# Patient Record
Sex: Female | Born: 1995 | Hispanic: No | Marital: Married | Smoking: Never smoker
Health system: Southern US, Community
[De-identification: ages and names within clinical notes are randomized; demographics above are authoritative.]

## PROBLEM LIST (undated history)

## (undated) DIAGNOSIS — Z789 Other specified health status: Secondary | ICD-10-CM

## (undated) HISTORY — PX: NO PAST SURGERIES: SHX2092

---
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
## 2014-10-11 ENCOUNTER — Ambulatory Visit
Admission: RE | Admit: 2014-10-11 | Discharge: 2014-10-11 | Disposition: A | Discharge status: Home / Self Care | Payer: No Typology Code available for payment source | Source: Ambulatory Visit | Attending: Infectious Disease | Admitting: Infectious Disease

## 2014-10-11 ENCOUNTER — Other Ambulatory Visit: Payer: Self-pay | Admitting: Infectious Disease

## 2014-10-11 DIAGNOSIS — R7611 Nonspecific reaction to tuberculin skin test without active tuberculosis: Secondary | ICD-10-CM

## 2015-08-12 LAB — OB RESULTS CONSOLE ABO/RH: RH Type: POSITIVE

## 2015-08-12 LAB — OB RESULTS CONSOLE GC/CHLAMYDIA
CHLAMYDIA, DNA PROBE: NEGATIVE
Gonorrhea: NEGATIVE

## 2015-08-12 LAB — OB RESULTS CONSOLE RUBELLA ANTIBODY, IGM: RUBELLA: IMMUNE

## 2015-08-12 LAB — OB RESULTS CONSOLE ANTIBODY SCREEN: Antibody Screen: NEGATIVE

## 2015-08-12 LAB — OB RESULTS CONSOLE RPR: RPR: NONREACTIVE

## 2015-08-12 LAB — OB RESULTS CONSOLE HEPATITIS B SURFACE ANTIGEN: HEP B S AG: NEGATIVE

## 2015-08-12 LAB — OB RESULTS CONSOLE HIV ANTIBODY (ROUTINE TESTING): HIV: NONREACTIVE

## 2015-09-24 ENCOUNTER — Other Ambulatory Visit: Payer: Self-pay | Admitting: Obstetrics & Gynecology

## 2015-09-24 DIAGNOSIS — Z3689 Encounter for other specified antenatal screening: Secondary | ICD-10-CM

## 2015-09-24 DIAGNOSIS — O280 Abnormal hematological finding on antenatal screening of mother: Secondary | ICD-10-CM

## 2015-09-24 DIAGNOSIS — IMO0002 Reserved for concepts with insufficient information to code with codable children: Secondary | ICD-10-CM

## 2015-09-29 NOTE — L&D Delivery Note (Signed)
Delivery Note At 02:11, a viable female was delivered via SVD.  APGAR: 7, 8; weight 2565g.   Placenta status: intact, spontaneous.  Cord: 3-vessel with the following complications: none.  Cord pH: not obtained  Anesthesia: epidural  Episiotomy: none Lacerations: 2nd degree perineal, bilateral labial Suture Repair: 3-0 vicryl for 2nd degree, 2-0 vicryl for labial tears Est. Blood Loss (mL): 150cc  Mom to postpartum.  Baby to Couplet care / Skin to Skin.  Amber ChestnutKelly E Moran 01/30/2016, 2:56 AM  Patient is a G1 at 8061w2d who was admitted for IOL due to nonreactive tracing, uncomplicated prenatal course.  She progressed with augmentation via foley/Pit/AROM.  I was gloved and present for delivery in its entirety.  Second stage of labor progressed to SVD.    Complications: none  Lacerations: see above  EBL: 150  Amber Moran, CNM 11:19 AM

## 2015-10-02 ENCOUNTER — Ambulatory Visit (HOSPITAL_COMMUNITY)
Admission: RE | Admit: 2015-10-02 | Discharge: 2015-10-02 | Disposition: A | Discharge status: Home / Self Care | Payer: Medicaid Other | Source: Ambulatory Visit | Attending: Obstetrics & Gynecology | Admitting: Obstetrics & Gynecology

## 2015-10-02 ENCOUNTER — Encounter (HOSPITAL_COMMUNITY): Payer: Self-pay

## 2015-10-02 ENCOUNTER — Other Ambulatory Visit: Payer: Self-pay | Admitting: Obstetrics & Gynecology

## 2015-10-02 VITALS — BP 103/62 | HR 93 | Wt 125.4 lb

## 2015-10-02 DIAGNOSIS — Z3689 Encounter for other specified antenatal screening: Secondary | ICD-10-CM

## 2015-10-02 DIAGNOSIS — O35EXX0 Maternal care for other (suspected) fetal abnormality and damage, fetal genitourinary anomalies, not applicable or unspecified: Secondary | ICD-10-CM

## 2015-10-02 DIAGNOSIS — O283 Abnormal ultrasonic finding on antenatal screening of mother: Secondary | ICD-10-CM | POA: Insufficient documentation

## 2015-10-02 DIAGNOSIS — O358XX0 Maternal care for other (suspected) fetal abnormality and damage, not applicable or unspecified: Secondary | ICD-10-CM

## 2015-10-02 DIAGNOSIS — Z3A23 23 weeks gestation of pregnancy: Secondary | ICD-10-CM

## 2015-10-02 DIAGNOSIS — O359XX0 Maternal care for (suspected) fetal abnormality and damage, unspecified, not applicable or unspecified: Secondary | ICD-10-CM

## 2015-10-02 DIAGNOSIS — IMO0002 Reserved for concepts with insufficient information to code with codable children: Secondary | ICD-10-CM

## 2015-10-02 DIAGNOSIS — O280 Abnormal hematological finding on antenatal screening of mother: Secondary | ICD-10-CM

## 2015-10-07 DIAGNOSIS — O359XX0 Maternal care for (suspected) fetal abnormality and damage, unspecified, not applicable or unspecified: Secondary | ICD-10-CM | POA: Insufficient documentation

## 2015-10-09 ENCOUNTER — Other Ambulatory Visit (HOSPITAL_COMMUNITY): Payer: Self-pay

## 2015-10-09 ENCOUNTER — Encounter (HOSPITAL_COMMUNITY): Payer: Self-pay

## 2015-11-13 ENCOUNTER — Ambulatory Visit (HOSPITAL_COMMUNITY)
Admission: RE | Admit: 2015-11-13 | Discharge: 2015-11-13 | Disposition: A | Discharge status: Home / Self Care | Payer: Medicaid Other | Source: Ambulatory Visit | Attending: Obstetrics & Gynecology | Admitting: Obstetrics & Gynecology

## 2015-11-13 DIAGNOSIS — IMO0002 Reserved for concepts with insufficient information to code with codable children: Secondary | ICD-10-CM

## 2015-11-13 DIAGNOSIS — O358XX0 Maternal care for other (suspected) fetal abnormality and damage, not applicable or unspecified: Secondary | ICD-10-CM | POA: Diagnosis present

## 2015-11-13 DIAGNOSIS — Z3A29 29 weeks gestation of pregnancy: Secondary | ICD-10-CM | POA: Diagnosis not present

## 2015-11-13 DIAGNOSIS — O283 Abnormal ultrasonic finding on antenatal screening of mother: Secondary | ICD-10-CM | POA: Insufficient documentation

## 2016-01-01 LAB — OB RESULTS CONSOLE GBS: GBS: NEGATIVE

## 2016-01-28 ENCOUNTER — Other Ambulatory Visit (HOSPITAL_COMMUNITY): Payer: Self-pay | Admitting: Nurse Practitioner

## 2016-01-28 ENCOUNTER — Encounter (HOSPITAL_COMMUNITY): Payer: Self-pay | Admitting: *Deleted

## 2016-01-28 ENCOUNTER — Inpatient Hospital Stay (HOSPITAL_COMMUNITY)
Admission: AD | Admit: 2016-01-28 | Discharge: 2016-01-31 | DRG: 775 | Disposition: A | Discharge status: Home / Self Care | Payer: Medicaid Other | Source: Ambulatory Visit | Attending: Family Medicine | Admitting: Family Medicine

## 2016-01-28 ENCOUNTER — Ambulatory Visit (HOSPITAL_COMMUNITY)
Admission: RE | Admit: 2016-01-28 | Discharge: 2016-01-28 | Disposition: A | Discharge status: Home / Self Care | Payer: Medicaid Other | Source: Ambulatory Visit | Attending: Nurse Practitioner | Admitting: Nurse Practitioner

## 2016-01-28 DIAGNOSIS — O480 Post-term pregnancy: Secondary | ICD-10-CM

## 2016-01-28 DIAGNOSIS — O9962 Diseases of the digestive system complicating childbirth: Secondary | ICD-10-CM | POA: Diagnosis present

## 2016-01-28 DIAGNOSIS — O289 Unspecified abnormal findings on antenatal screening of mother: Secondary | ICD-10-CM

## 2016-01-28 DIAGNOSIS — K219 Gastro-esophageal reflux disease without esophagitis: Secondary | ICD-10-CM | POA: Diagnosis present

## 2016-01-28 DIAGNOSIS — Z3A40 40 weeks gestation of pregnancy: Secondary | ICD-10-CM

## 2016-01-28 DIAGNOSIS — IMO0002 Reserved for concepts with insufficient information to code with codable children: Secondary | ICD-10-CM | POA: Diagnosis present

## 2016-01-28 HISTORY — DX: Other specified health status: Z78.9

## 2016-01-28 LAB — CBC
HEMATOCRIT: 39.4 % (ref 36.0–46.0)
HEMOGLOBIN: 13.7 g/dL (ref 12.0–15.0)
MCH: 30.2 pg (ref 26.0–34.0)
MCHC: 34.8 g/dL (ref 30.0–36.0)
MCV: 87 fL (ref 78.0–100.0)
Platelets: 234 10*3/uL (ref 150–400)
RBC: 4.53 MIL/uL (ref 3.87–5.11)
RDW: 13.6 % (ref 11.5–15.5)
WBC: 13.6 10*3/uL — ABNORMAL HIGH (ref 4.0–10.5)

## 2016-01-28 LAB — ABO/RH: ABO/RH(D): AB POS

## 2016-01-28 LAB — TYPE AND SCREEN
ABO/RH(D): AB POS
Antibody Screen: NEGATIVE

## 2016-01-28 MED ORDER — OXYTOCIN BOLUS FROM INFUSION
500.0000 mL | INTRAVENOUS | Status: DC
  Administered 2016-01-30: 500 mL via INTRAVENOUS

## 2016-01-28 MED ORDER — OXYCODONE-ACETAMINOPHEN 5-325 MG PO TABS: 2.0000 | ORAL_TABLET | ORAL | Status: DC | PRN

## 2016-01-28 MED ORDER — OXYTOCIN 10 UNIT/ML IJ SOLN: 2.5000 [IU]/h | INTRAVENOUS | Status: DC

## 2016-01-28 MED ORDER — TERBUTALINE SULFATE 1 MG/ML IJ SOLN
0.2500 mg | Freq: Once | INTRAMUSCULAR | Status: DC | PRN
  Filled 2016-01-28: qty 1

## 2016-01-28 MED ORDER — LACTATED RINGERS IV SOLN
INTRAVENOUS | Status: DC
  Administered 2016-01-28 – 2016-01-30 (×5): via INTRAVENOUS

## 2016-01-28 MED ORDER — LIDOCAINE HCL (PF) 1 % IJ SOLN
30.0000 mL | INTRAMUSCULAR | Status: DC | PRN
  Filled 2016-01-28: qty 30

## 2016-01-28 MED ORDER — LACTATED RINGERS IV SOLN
500.0000 mL | INTRAVENOUS | Status: DC | PRN
  Administered 2016-01-29: 250 mL via INTRAVENOUS
  Administered 2016-01-29: 500 mL via INTRAVENOUS
  Administered 2016-01-30: 250 mL via INTRAVENOUS

## 2016-01-28 MED ORDER — OXYCODONE-ACETAMINOPHEN 5-325 MG PO TABS: 1.0000 | ORAL_TABLET | ORAL | Status: DC | PRN

## 2016-01-28 MED ORDER — ONDANSETRON HCL 4 MG/2ML IJ SOLN
4.0000 mg | Freq: Four times a day (QID) | INTRAMUSCULAR | Status: DC | PRN
  Administered 2016-01-29: 4 mg via INTRAVENOUS
  Filled 2016-01-28: qty 2

## 2016-01-28 MED ORDER — OXYTOCIN 10 UNIT/ML IJ SOLN: 10.0000 [IU] | Freq: Once | INTRAMUSCULAR | Status: DC

## 2016-01-28 MED ORDER — CITRIC ACID-SODIUM CITRATE 334-500 MG/5ML PO SOLN: 30.0000 mL | ORAL | Status: DC | PRN

## 2016-01-28 MED ORDER — FENTANYL CITRATE (PF) 100 MCG/2ML IJ SOLN: 50.0000 ug | INTRAMUSCULAR | Status: DC | PRN

## 2016-01-28 MED ORDER — SODIUM CHLORIDE 0.9% FLUSH: 3.0000 mL | Freq: Two times a day (BID) | INTRAVENOUS | Status: DC

## 2016-01-28 MED ORDER — SODIUM CHLORIDE 0.9 % IV SOLN: 250.0000 mL | INTRAVENOUS | Status: DC | PRN

## 2016-01-28 MED ORDER — SODIUM CHLORIDE 0.9% FLUSH: 3.0000 mL | INTRAVENOUS | Status: DC | PRN

## 2016-01-28 MED ORDER — ACETAMINOPHEN 325 MG PO TABS: 650.0000 mg | ORAL_TABLET | ORAL | Status: DC | PRN

## 2016-01-28 MED ORDER — MISOPROSTOL 25 MCG QUARTER TABLET
25.0000 ug | ORAL_TABLET | ORAL | Status: DC | PRN
  Filled 2016-01-28: qty 1

## 2016-01-28 NOTE — MAU Note (Signed)
Pt was sent over from MFM for a 6/8 BPP.  Pt states she is feeling contractions every 7-12 minutes.

## 2016-01-28 NOTE — MAU Provider Note (Signed)
See H&P.  Sheridan LakeVirginia Akaysha Cobern, [place] 01/28/2016 6:09 PM

## 2016-01-28 NOTE — H&P (Signed)
HPI: Amber Moran is a 20 y.o. year old 461P0000 female at 331w0d weeks gestation by LMP and 18 week ultrasound who [facility] BPP was performed as the patient had increased Down syndrome risk on quad screen and fetal pyelectasis. NST performed without department was borderline reactive, so [facility]. To offer no fetal breathing movement. Prolonged fetal monitoring in MAU was again borderline-reactive even after multiple position changes and hydration of patient. Dr. Shawnie PonsPratt was consulted and recommended induction of labor.  [facility] weeks.  History OB History    Gravida Para Term Preterm AB TAB SAB Ectopic Multiple Living   1 0 0 0 0 0 0 0 0 0      Past Medical History  Diagnosis Date  . Medical history non-contributory    Past Surgical History  Procedure Laterality Date  . No past surgeries     Family History: Family history is unknown by patient. Social History:  reports that she has never smoked. She does not have any smokeless tobacco history on file. She reports that she does not drink alcohol or use illicit drugs.   Prenatal Transfer Tool  Maternal Diabetes: No Genetic Screening: Abnormal:  Results: Elevated risk of Trisomy 21 Maternal Ultrasounds/Referrals: Abnormal:  Findings:   Fetal renal pyelectasis, Other: Fetal Ultrasounds or other Referrals:  Referred to Materal Fetal Medicine  Maternal Substance Abuse:  No Significant Maternal Medications:  None Significant Maternal Lab Results:  Lab values include: Group B Strep negative Other Comments:  Physiologic pericardial effusion noted on ultrasound 01/28/2016.  Review of Systems  Constitutional: Negative for fever and chills.  Eyes: Negative for blurred vision.  Gastrointestinal: Positive for abdominal pain (mild contractions).  Genitourinary:       Pos for decreased fetal mvmt. Neg for VB or  LOF.   Neurological: Negative for headaches.    Dilation: 1 Effacement (%): Thick Exam by:: Dorathy KinsmanVirginia Vennessa Affinito, CNM Blood pressure 114/70, pulse 77, temperature 98.2 F (36.8 C), temperature source Oral, resp. rate 16, last menstrual period 04/23/2015. Maternal Exam:  Uterine Assessment: Contraction strength is mild.  Contraction frequency is irregular.   Abdomen: Patient reports no abdominal tenderness. Estimated fetal weight is 7-4.   Fetal presentation: vertex  Introitus: Normal vulva. Normal vagina.  Pelvis: adequate for delivery.   Cervix: Cervix evaluated by digital exam.     Fetal Exam Fetal Monitor Review: Mode: ultrasound.   Baseline rate: 145.  Variability: moderate (6-25 bpm).   Pattern: accelerations present and no decelerations.    Fetal State Assessment: Category II - tracings are indeterminate.     Physical Exam  Nursing note and vitals reviewed. Constitutional: She is oriented to person, place, and time. She appears well-developed and well-nourished. No distress.  HENT:  Head: Normocephalic.  Eyes: Conjunctivae are normal. No scleral icterus.  Cardiovascular: Normal rate, regular rhythm and normal heart sounds.   Respiratory: Effort normal and breath sounds normal.  GI: Soft. There is no tenderness.  Genitourinary: Vagina normal.  Musculoskeletal: She exhibits no edema or tenderness.  Neurological: She is alert and oriented to person, place, and time. She has normal reflexes.  Skin: Skin is warm and dry.  Psychiatric: She has a normal mood and affect.    Prenatal labs: ABO, Rh:  AB positive Antibody:  negative Rubella:  immune RPR:   negative HBsAg:   negative HIV:  nonreactive GBS:   negative Quad screen: Increased Down syndrome risk 1:227 One hour GTT: 107  Assessment: 1. Labor: IOL 2. Fetal Wellbeing: Category I-II  3. Pain Control: none 4. GBS: Negative 5. 40.0 week IUP  Plan:  1. Admit to BS per consult with MD 2. Routine L&D  orders 3. Analgesia/anesthesia PRN  4. Cytotec or Foley bulb induction   Brooke Steinhilber 01/28/2016, 6:25 PM

## 2016-01-28 NOTE — Progress Notes (Signed)
LABOR PROGRESS NOTE  Amber Moran is a 20 y.o. G1P0000 at 10367w0d  admitted for IOL for NR fetal testing at term.  Subjective: Patient overall comfortable and just ate.  Objective: BP 100/65 mmHg  Pulse 85  Temp(Src) 98.2 F (36.8 C) (Oral)  Resp 20  Ht 5\' 1"  (1.549 m)  Wt 140 lb (63.504 kg)  BMI 26.47 kg/m2  LMP 04/23/2015 or  Filed Vitals:   01/28/16 1616 01/28/16 1918 01/28/16 1922  BP: 114/70  100/65  Pulse: 77  85  Temp: 98.2 F (36.8 C)  98.2 F (36.8 C)  TempSrc: Oral  Oral  Resp: 16  20  Height:  5\' 1"  (1.549 m)   Weight:  140 lb (63.504 kg)     Gen: NAD Chest: normal WOB  Dilation: 1 Effacement (%): Thick Cervical Position: Posterior Presentation: Vertex Exam by:: Dorathy KinsmanVirginia Moran, CNM  Labs: Lab Results  Component Value Date   WBC 13.6* 01/28/2016   HGB 13.7 01/28/2016   HCT 39.4 01/28/2016   MCV 87.0 01/28/2016   PLT 234 01/28/2016    Patient Active Problem List   Diagnosis Date Noted  . Fetal heart rate nonreactive 01/28/2016  . Suspected fetal anomaly, antepartum 10/07/2015    Assessment / Plan: 20 y.o. G1P0000 at 7767w0d here for IOL for NR fetal testing.  Labor: SVE 1/thick/high.  Foley balloon placed at 20:50.  Currently having some spontaneous contractions, but consider oral Cytotec if these space out or stop. Fetal Wellbeing:  Category 1 tracing Pain Control:  None currently Anticipated MOD:  Anticipate SVD  Caesar ChestnutKelly E Aneliz Carbary, MD 01/28/2016, 8:58 PM

## 2016-01-29 ENCOUNTER — Encounter (HOSPITAL_COMMUNITY): Payer: Self-pay | Admitting: Anesthesiology

## 2016-01-29 ENCOUNTER — Inpatient Hospital Stay (HOSPITAL_COMMUNITY): Payer: Medicaid Other | Admitting: Anesthesiology

## 2016-01-29 LAB — RPR: RPR Ser Ql: NONREACTIVE

## 2016-01-29 MED ORDER — DIPHENHYDRAMINE HCL 50 MG/ML IJ SOLN: 12.5000 mg | INTRAMUSCULAR | Status: DC | PRN

## 2016-01-29 MED ORDER — LACTATED RINGERS IV SOLN
500.0000 mL | Freq: Once | INTRAVENOUS | Status: AC
  Administered 2016-01-29: 500 mL via INTRAVENOUS

## 2016-01-29 MED ORDER — OXYTOCIN 10 UNIT/ML IJ SOLN
1.0000 m[IU]/min | INTRAMUSCULAR | Status: DC
  Administered 2016-01-29: 6 m[IU]/min via INTRAVENOUS
  Administered 2016-01-29: 12 m[IU]/min via INTRAVENOUS
  Administered 2016-01-29: 2 m[IU]/min via INTRAVENOUS
  Filled 2016-01-29: qty 4

## 2016-01-29 MED ORDER — FENTANYL 2.5 MCG/ML BUPIVACAINE 1/10 % EPIDURAL INFUSION (WH - ANES)
14.0000 mL/h | INTRAMUSCULAR | Status: DC | PRN
  Administered 2016-01-29: 12 mL/h via EPIDURAL
  Administered 2016-01-29: 14 mL/h via EPIDURAL

## 2016-01-29 MED ORDER — PHENYLEPHRINE 40 MCG/ML (10ML) SYRINGE FOR IV PUSH (FOR BLOOD PRESSURE SUPPORT)
80.0000 ug | PREFILLED_SYRINGE | INTRAVENOUS | Status: DC | PRN
  Filled 2016-01-29: qty 5

## 2016-01-29 MED ORDER — TERBUTALINE SULFATE 1 MG/ML IJ SOLN
0.2500 mg | Freq: Once | INTRAMUSCULAR | Status: DC | PRN
  Filled 2016-01-29: qty 1

## 2016-01-29 MED ORDER — EPHEDRINE 5 MG/ML INJ
10.0000 mg | INTRAVENOUS | Status: DC | PRN
  Filled 2016-01-29: qty 2

## 2016-01-29 MED ORDER — FENTANYL CITRATE (PF) 100 MCG/2ML IJ SOLN
100.0000 ug | INTRAMUSCULAR | Status: DC | PRN
  Administered 2016-01-29: 100 ug via INTRAVENOUS
  Filled 2016-01-29: qty 2

## 2016-01-29 MED ORDER — LIDOCAINE HCL (PF) 1 % IJ SOLN
INTRAMUSCULAR | Status: DC | PRN
  Administered 2016-01-29: 4 mL via EPIDURAL
  Administered 2016-01-29: 3 mL via EPIDURAL

## 2016-01-29 MED ORDER — BUTORPHANOL TARTRATE 1 MG/ML IJ SOLN
1.0000 mg | INTRAMUSCULAR | Status: DC | PRN
  Administered 2016-01-29 (×4): 1 mg via INTRAVENOUS
  Filled 2016-01-29 (×4): qty 1

## 2016-01-29 MED ORDER — PHENYLEPHRINE 40 MCG/ML (10ML) SYRINGE FOR IV PUSH (FOR BLOOD PRESSURE SUPPORT)
PREFILLED_SYRINGE | INTRAVENOUS | Status: AC
  Filled 2016-01-29: qty 20

## 2016-01-29 MED ORDER — FENTANYL 2.5 MCG/ML BUPIVACAINE 1/10 % EPIDURAL INFUSION (WH - ANES)
INTRAMUSCULAR | Status: AC
  Filled 2016-01-29: qty 125

## 2016-01-29 NOTE — Progress Notes (Signed)
LABOR PROGRESS NOTE  Amber Moran is a 20 y.o. G1P0000 at 6749w1d  admitted for IOL for NR fetal testing at term.  Subjective: Patient feeling cramping.  Foley balloon still in place.  Objective: BP 115/65 mmHg  Pulse 78  Temp(Src) 98.9 F (37.2 C) (Oral)  Resp 16  Ht 5\' 1"  (1.549 m)  Wt 140 lb (63.504 kg)  BMI 26.47 kg/m2  LMP 04/23/2015 or  Filed Vitals:   01/28/16 1918 01/28/16 1922 01/28/16 2248 01/28/16 2249  BP:  100/65  115/65  Pulse:  85  78  Temp:  98.2 F (36.8 C) 98.9 F (37.2 C)   TempSrc:  Oral    Resp:  20  16  Height: 5\' 1"  (1.549 m)     Weight: 140 lb (63.504 kg)       Gen: alert, in NAD Chest: normal WOB Dilation: 1 Effacement (%): Thick Cervical Position: Posterior Station: -3 Presentation: Vertex Exam by:: Dr Felipa FurnaceGarcia  Labs: Lab Results  Component Value Date   WBC 13.6* 01/28/2016   HGB 13.7 01/28/2016   HCT 39.4 01/28/2016   MCV 87.0 01/28/2016   PLT 234 01/28/2016    Patient Active Problem List   Diagnosis Date Noted  . Fetal heart rate nonreactive 01/28/2016  . Suspected fetal anomaly, antepartum 10/07/2015    Assessment / Plan: 20 y.o. G1P0000 at 1149w1d here for IOL for NR fetal testing at term.  Labor: Foley balloon in place.  Appears to be contracting q 2-3 min.  Will defer Cytotec at this time. Fetal Wellbeing:  Category 1 tracing Pain Control:  None currently Anticipated MOD:  Anticipate SVD  Caesar ChestnutKelly E Norval Slaven, MD 01/29/2016, 12:26 AM

## 2016-01-29 NOTE — Progress Notes (Signed)
LABOR PROGRESS NOTE  Shenia Venable is a 20 y.o. G1P0000 at 4392w1d  admitted for IOL for NR fetal testing at term.  Subjective: Patient more comfortable with epidural but feeling pressure in bottom.  Objective: BP 120/59 mmHg  Pulse 97  Temp(Src) 98.9 F (37.2 C) (Oral)  Resp 18  Ht 5\' 1"  (1.549 m)  Wt 140 lb (63.504 kg)  BMI 26.47 kg/m2  SpO2 100%  LMP 04/23/2015 or  Filed Vitals:   01/29/16 2111 01/29/16 2113 01/29/16 2131 01/29/16 2201  BP: 125/73  112/56 120/59  Pulse: 90 91 113 97  Temp: 98.9 F (37.2 C)     TempSrc: Oral     Resp: 18  18 18   Height:      Weight:      SpO2:  100%       Dilation: 9 Effacement (%): 100 Cervical Position: Anterior Station: 0 Presentation: Vertex Exam by:: Felipa FurnaceGarcia  Labs: Lab Results  Component Value Date   WBC 13.6* 01/28/2016   HGB 13.7 01/28/2016   HCT 39.4 01/28/2016   MCV 87.0 01/28/2016   PLT 234 01/28/2016    Patient Active Problem List   Diagnosis Date Noted  . Fetal heart rate nonreactive 01/28/2016  . Suspected fetal anomaly, antepartum 10/07/2015    Assessment / Plan: 20 y.o. G1P0000 at 6292w1d here for IOL for NR fetal testing at term.  Labor: Micah FlesherWent to place IUPC to aid in pitocin titration/allow possible amnioinfusion, but patient is now 9/100/0.  Will continue pitocin. Fetal Wellbeing:  Category 2 with recurrent variables Pain Control:  Epidural Anticipated MOD:  Anticipate SVD  Caesar ChestnutKelly E Kalise Fickett, MD 01/29/2016, 10:34 PM

## 2016-01-29 NOTE — Anesthesia Procedure Notes (Signed)
Epidural Patient location during procedure: OB Start time: 01/29/2016 8:30 PM  Staffing Anesthesiologist: Mal AmabileFOSTER, Grizelda Piscopo Performed by: anesthesiologist   Preanesthetic Checklist Completed: patient identified, site marked, surgical consent, pre-op evaluation, timeout performed, IV checked, risks and benefits discussed and monitors and equipment checked  Epidural Patient position: sitting Prep: site prepped and draped and DuraPrep Patient monitoring: continuous pulse ox and blood pressure Approach: midline Location: L3-L4 Injection technique: LOR air  Needle:  Needle type: Tuohy  Needle gauge: 17 G Needle length: 9 cm and 9 Needle insertion depth: 4 cm Catheter type: closed end flexible Catheter size: 19 Gauge Catheter at skin depth: 10 cm Test dose: negative and Other  Assessment Events: blood not aspirated, injection not painful, no injection resistance, negative IV test and no paresthesia  Additional Notes Patient identified. Risks and benefits discussed including failed block, incomplete  Pain control, post dural puncture headache, nerve damage, paralysis, blood pressure Changes, nausea, vomiting, reactions to medications-both toxic and allergic and post Partum back pain. All questions were answered. Patient expressed understanding and wished to proceed. Sterile technique was used throughout procedure. Epidural site was Dressed with sterile barrier dressing. No paresthesias, signs of intravascular injection Or signs of intrathecal spread were encountered.  Patient was more comfortable after the epidural was dosed. Please see RN's note for documentation of vital signs and FHR which are stable.

## 2016-01-29 NOTE — Progress Notes (Signed)
LABOR PROGRESS NOTE  Amber Moran is a 20 y.o. G1P0000 at 324w1d  admitted for IOL for non-reactive tracing.  Subjective: Doing well, stadol is helping pain  Objective: BP 103/69 mmHg  Pulse 91  Temp(Src) 98.4 F (36.9 C) (Oral)  Resp 18  Ht 5\' 1"  (1.549 m)  Wt 140 lb (63.504 kg)  BMI 26.47 kg/m2  LMP 04/23/2015 or  Filed Vitals:   01/29/16 1529 01/29/16 1530 01/29/16 1600 01/29/16 1633  BP: 101/54 101/54 106/62 103/69  Pulse: 80 80 89 91  Temp:      TempSrc:      Resp:  18    Height:      Weight:        Dilation: 6 Effacement (%): 70, 80 Cervical Position: Anterior Station: -1 Presentation: Vertex Exam by:: L. CLEMMONS,CNM  Labs: Lab Results  Component Value Date   WBC 13.6* 01/28/2016   HGB 13.7 01/28/2016   HCT 39.4 01/28/2016   MCV 87.0 01/28/2016   PLT 234 01/28/2016    Patient Active Problem List   Diagnosis Date Noted  . Fetal heart rate nonreactive 01/28/2016  . Suspected fetal anomaly, antepartum 10/07/2015    Assessment / Plan: 20 y.o. G1P0000 at 554w1d here for IOL for non-reassuring tracing  Labor: Progressing on Pit. AROM (clear) at 1700. Fetal Wellbeing:  Category I Pain Control:  Well-controlled on Stadol Anticipated MOD:  SVD  Hilton SinclairKaty D Yuliet Needs, MD 01/29/2016, 5:04 PM

## 2016-01-29 NOTE — Progress Notes (Signed)
LABOR PROGRESS NOTE  Amber Moran is a 20 y.o. G1P0000 at 573w1d  admitted for IOL for NR fetal testing at term.  Subjective: Patient is fairly comfortable after receiving stadol and is now sleeping.  Patient reports that she will wait to decide on the epidural.  Objective: BP 105/68 mmHg  Pulse 82  Temp(Src) 98.4 F (36.9 C) (Oral)  Resp 18  Ht 5\' 1"  (1.549 m)  Wt 63.504 kg (140 lb)  BMI 26.47 kg/m2  LMP 04/23/2015 or  Filed Vitals:   01/29/16 0930 01/29/16 1000 01/29/16 1030 01/29/16 1100  BP: 106/66 104/58 104/68 105/68  Pulse: 86 86 90 82  Temp:  98.4 F (36.9 C)    TempSrc:  Oral    Resp:  18    Height:      Weight:         Dilation: 6 Effacement (%): 80 Cervical Position: Anterior Station: -2 Presentation: Vertex Exam by:: Doreene Burke. Taeuber, RN  Labs: Lab Results  Component Value Date   WBC 13.6* 01/28/2016   HGB 13.7 01/28/2016   HCT 39.4 01/28/2016   MCV 87.0 01/28/2016   PLT 234 01/28/2016    Patient Active Problem List   Diagnosis Date Noted  . Fetal heart rate nonreactive 01/28/2016  . Suspected fetal anomaly, antepartum 10/07/2015    Assessment / Plan: 20 y.o. G1P0000 at 1873w1d here for IOL for NR fetal testing at term.  Labor: 6/80/-2 Fetal Wellbeing:  140/mod/+a/-d Pain Control:  Good Anticipated MOD:  Vaginal  Jonni SangerSara Debralee Braaksma, MD 01/29/2016, 11:41 AM

## 2016-01-29 NOTE — Progress Notes (Signed)
LABOR PROGRESS NOTE  Amber Moran is a 20 y.o. G1P0000 at 8844w1d  admitted for IOL for NR fetal testing at term.  Subjective: Patient fairly comfortable.  Foley balloon comes out with gentle traction.  Objective: BP 118/71 mmHg  Pulse 79  Temp(Src) 98.9 F (37.2 C) (Oral)  Resp 16  Ht 5\' 1"  (1.549 m)  Wt 140 lb (63.504 kg)  BMI 26.47 kg/m2  LMP 04/23/2015 or  Filed Vitals:   01/28/16 2248 01/28/16 2249 01/29/16 0219 01/29/16 0405  BP:  115/65 118/71   Pulse:  78 79   Temp: 98.9 F (37.2 C)  98.5 F (36.9 C) 98.9 F (37.2 C)  TempSrc:   Oral Oral  Resp:  16 16   Height:      Weight:        Gen: in NAD Chest: normal WOB   Dilation: 4.5 Effacement (%): 60 Cervical Position: Posterior Station: -2 Presentation: Vertex Exam by:: Dr. Felipa FurnaceGarcia  Labs: Lab Results  Component Value Date   WBC 13.6* 01/28/2016   HGB 13.7 01/28/2016   HCT 39.4 01/28/2016   MCV 87.0 01/28/2016   PLT 234 01/28/2016    Patient Active Problem List   Diagnosis Date Noted  . Fetal heart rate nonreactive 01/28/2016  . Suspected fetal anomaly, antepartum 10/07/2015    Assessment / Plan: 20 y.o. G1P0000 at 10844w1d here for IOL for NR fetal testing at term.  Labor: Foley balloon out at 05:55. Fetal Wellbeing:  Category 1 tracing Pain Control:  IV pain medication only Anticipated MOD:  Anticipate SVD  Caesar ChestnutKelly E Belmira Daley, MD 01/29/2016, 6:03 AM

## 2016-01-29 NOTE — Anesthesia Preprocedure Evaluation (Signed)
Anesthesia Evaluation  Patient identified by MRN, date of birth, ID band Patient awake    Reviewed: Allergy & Precautions, Patient's Chart, lab work & pertinent test results  Airway Mallampati: II  TM Distance: >3 FB Neck ROM: Full    Dental no notable dental hx. (+) Teeth Intact   Pulmonary neg pulmonary ROS,    Pulmonary exam normal breath sounds clear to auscultation       Cardiovascular negative cardio ROS Normal cardiovascular exam Rhythm:Regular Rate:Normal     Neuro/Psych negative neurological ROS  negative psych ROS   GI/Hepatic Neg liver ROS, GERD  Medicated and Controlled,  Endo/Other  negative endocrine ROS  Renal/GU negative Renal ROS  negative genitourinary   Musculoskeletal negative musculoskeletal ROS (+)   Abdominal   Peds  Hematology negative hematology ROS (+)   Anesthesia Other Findings   Reproductive/Obstetrics (+) Pregnancy                            Anesthesia Physical Anesthesia Plan  ASA: II  Anesthesia Plan: Epidural   Post-op Pain Management:    Induction:   Airway Management Planned: Natural Airway  Additional Equipment:   Intra-op Plan:   Post-operative Plan:   Informed Consent: I have reviewed the patients History and Physical, chart, labs and discussed the procedure including the risks, benefits and alternatives for the proposed anesthesia with the patient or authorized representative who has indicated his/her understanding and acceptance.     Plan Discussed with: Anesthesiologist  Anesthesia Plan Comments:         Anesthesia Quick Evaluation  

## 2016-01-29 NOTE — Anesthesia Pain Management Evaluation Note (Signed)
  CRNA Pain Management Visit Note  Patient: Amber Moran, 20 y.o., female  "Hello I am a member of the anesthesia team at [facility]. We have an anesthesia team available at all times to provide care throughout the hospital, including epidural management and anesthesia for C-section. I don't know your plan for the delivery whether it a natural birth, water birth, IV sedation, nitrous supplementation, doula or epidural, but we want to meet your pain goals."   1.Was your pain managed to your expectations on prior hospitalizations?   Unable to assess - patient sleeping  2.What is your expectation for pain management during this hospitalization?  sleeping 3.How can we help you reach that goal? sleeping  Record the patient's initial score and the patient's pain goal.   Pain: comfortable, sleeping  Pain Goal: unable to acess, patient sleeping [facility] wants you to be able to say your pain was always managed very well.  [facility],[name] 01/29/2016

## 2016-01-29 NOTE — Progress Notes (Signed)
LABOR PROGRESS NOTE  Amber Moran is a 20 y.o. G1P0000 at 2958w1d  admitted for IOL for non-reactive tracing  Subjective: Pt is doing well. States she is starting to feel more pain now. Stadol is helping the pain.  Objective: BP 110/70 mmHg  Pulse 96  Temp(Src) 98.4 F (36.9 C) (Oral)  Resp 18  Ht 5\' 1"  (1.549 m)  Wt 140 lb (63.504 kg)  BMI 26.47 kg/m2  LMP 04/23/2015 or  Filed Vitals:   01/29/16 1300 01/29/16 1330 01/29/16 1400 01/29/16 1430  BP: 101/59 104/63 105/67 110/70  Pulse: 82 88 90 96  Temp:   98.4 F (36.9 C)   TempSrc:      Resp:   18   Height:      Weight:        Dilation: 8 Effacement (%): 90 Cervical Position: Anterior Station: -1 Presentation: Vertex Exam by:: DR. MAYO  Labs: Lab Results  Component Value Date   WBC 13.6* 01/28/2016   HGB 13.7 01/28/2016   HCT 39.4 01/28/2016   MCV 87.0 01/28/2016   PLT 234 01/28/2016    Patient Active Problem List   Diagnosis Date Noted  . Fetal heart rate nonreactive 01/28/2016  . Suspected fetal anomaly, antepartum 10/07/2015    Assessment / Plan: 20 y.o. G1P0000 at 9158w1d here for IOL for non-reactive tracing  Labor: Progressing well on Pitocin. Currently 8cm dilated with a bulging bag Fetal Wellbeing:  Category I Pain Control:  Well-controlled with Stadol. Does not want an epidural Anticipated MOD:  SVD  Hilton SinclairKaty D Mayo, MD 01/29/2016, 2:42 PM

## 2016-01-30 ENCOUNTER — Encounter (HOSPITAL_COMMUNITY): Payer: Self-pay | Admitting: *Deleted

## 2016-01-30 DIAGNOSIS — Z3A40 40 weeks gestation of pregnancy: Secondary | ICD-10-CM

## 2016-01-30 DIAGNOSIS — O9962 Diseases of the digestive system complicating childbirth: Secondary | ICD-10-CM

## 2016-01-30 MED ORDER — BENZOCAINE-MENTHOL 20-0.5 % EX AERO: 1.0000 "application " | INHALATION_SPRAY | CUTANEOUS | Status: DC | PRN

## 2016-01-30 MED ORDER — TETANUS-DIPHTH-ACELL PERTUSSIS 5-2.5-18.5 LF-MCG/0.5 IM SUSP
0.5000 mL | Freq: Once | INTRAMUSCULAR | Status: DC
Start: 2016-01-31 — End: 2016-01-31

## 2016-01-30 MED ORDER — SIMETHICONE 80 MG PO CHEW: 80.0000 mg | CHEWABLE_TABLET | ORAL | Status: DC | PRN

## 2016-01-30 MED ORDER — ONDANSETRON HCL 4 MG PO TABS: 4.0000 mg | ORAL_TABLET | ORAL | Status: DC | PRN

## 2016-01-30 MED ORDER — SENNOSIDES-DOCUSATE SODIUM 8.6-50 MG PO TABS
2.0000 | ORAL_TABLET | ORAL | Status: DC
Start: 2016-01-31 — End: 2016-01-31
  Administered 2016-01-30: 2 via ORAL
  Filled 2016-01-30: qty 2

## 2016-01-30 MED ORDER — PRENATAL MULTIVITAMIN CH
1.0000 | ORAL_TABLET | Freq: Every day | ORAL | Status: DC
  Administered 2016-01-30 – 2016-01-31 (×2): 1 via ORAL
  Filled 2016-01-30 (×2): qty 1

## 2016-01-30 MED ORDER — WITCH HAZEL-GLYCERIN EX PADS: 1.0000 "application " | MEDICATED_PAD | CUTANEOUS | Status: DC | PRN

## 2016-01-30 MED ORDER — ZOLPIDEM TARTRATE 5 MG PO TABS: 5.0000 mg | ORAL_TABLET | Freq: Every evening | ORAL | Status: DC | PRN

## 2016-01-30 MED ORDER — ONDANSETRON HCL 4 MG/2ML IJ SOLN: 4.0000 mg | INTRAMUSCULAR | Status: DC | PRN

## 2016-01-30 MED ORDER — DIBUCAINE 1 % RE OINT: 1.0000 "application " | TOPICAL_OINTMENT | RECTAL | Status: DC | PRN

## 2016-01-30 MED ORDER — IBUPROFEN 600 MG PO TABS
600.0000 mg | ORAL_TABLET | Freq: Four times a day (QID) | ORAL | Status: DC
Start: 2016-01-30 — End: 2016-01-31
  Administered 2016-01-30 – 2016-01-31 (×6): 600 mg via ORAL
  Filled 2016-01-30 (×6): qty 1

## 2016-01-30 MED ORDER — DIPHENHYDRAMINE HCL 25 MG PO CAPS: 25.0000 mg | ORAL_CAPSULE | Freq: Four times a day (QID) | ORAL | Status: DC | PRN

## 2016-01-30 MED ORDER — ACETAMINOPHEN 325 MG PO TABS: 650.0000 mg | ORAL_TABLET | ORAL | Status: DC | PRN

## 2016-01-30 MED ORDER — COCONUT OIL OIL: 1.0000 "application " | TOPICAL_OIL | Status: DC | PRN

## 2016-01-30 NOTE — Anesthesia Postprocedure Evaluation (Signed)
Anesthesia Post Note  Patient: Amber Moran  Procedure(s) Performed: * No procedures listed *  Patient location during evaluation: Mother Baby Anesthesia Type: Epidural Level of consciousness: awake and alert Pain management: satisfactory to patient Vital Signs Assessment: post-procedure vital signs reviewed and stable Respiratory status: respiratory function stable Cardiovascular status: stable Postop Assessment: no headache, no backache, epidural receding, patient able to bend at knees, no signs of nausea or vomiting and adequate PO intake Anesthetic complications: no Comments: Comfort level was assessed by AnesthesiaTeam and the patient was pleased with the care, interventions, and services provided by the Department of Anesthesia.     Last Vitals:  Filed Vitals:   01/30/16 0410 01/30/16 0514  BP: 104/58 94/53  Pulse: 91 91  Temp: 36.7 C 36.8 C  Resp: 18 18    Last Pain:  Filed Vitals:   01/30/16 0515  PainSc: 7    Pain Goal: Patients Stated Pain Goal: 3 (01/29/16 1858)               Karleen DolphinFUSSELL,Cobey Raineri

## 2016-01-30 NOTE — Lactation Note (Signed)
This note was copied from a baby's chart. Lactation Consultation Note  Patient Name: Boy Olin HauserHandi Wadhwa ZOXWR'UToday's Date: 01/30/2016 Reason for consult: Initial assessment Baby at 15 hr of life and RN requested help with latch. Upon entry FOB stated they have been offering formula. Discussed baby behavior, feeding frequency, supplementing, baby belly size, voids, wt loss, breast changes, and nipple care. Mom stated she can manually express, has seen colostrum, and has a spoon in the room. Offered DEBP but mom declined. FOB seemed like they were going to formula for now. Given lactation handouts. Aware of OP services and support group. Encouraged mom to call at next feeding cue if she changes her mind and would like help latching or pumping.     Maternal Data Has patient been taught Hand Expression?: Yes Does the patient have breastfeeding experience prior to this delivery?: No  Feeding Feeding Type: Bottle Fed - Formula Nipple Type: Slow - flow  LATCH Score/Interventions                      Lactation Tools Discussed/Used WIC Program: Yes   Consult Status Consult Status: PRN    Rulon Eisenmengerlizabeth E Yolette Hastings 01/30/2016, 5:59 PM

## 2016-01-30 NOTE — Anesthesia Post-op Follow-up Note (Cosign Needed)
  Anesthesia Pain Follow-up Note  Patient: Amber Moran  Day #: 1  Date of Follow-up: 01/30/2016 Time: 9:10 AM  Last Vitals:  Filed Vitals:   01/30/16 0410 01/30/16 0514  BP: 104/58 94/53  Pulse: 91 91  Temp: 36.7 C 36.8 C  Resp: 18 18    Level of Consciousness: alert  Pain: 1 /10   Side Effects:None  Catheter Site Exam:healing  Plan: D/C from anesthesia care  Eldora Napp

## 2016-01-31 MED ORDER — IBUPROFEN 600 MG PO TABS: 600.0000 mg | ORAL_TABLET | Freq: Four times a day (QID) | ORAL | Status: DC

## 2016-01-31 NOTE — Lactation Note (Signed)
This note was copied from a baby's chart. Lactation Consultation Note  Patient Name: Amber Moran Reason for consult: Follow-up assessment  Baby 33 hours old. Mom reports that she has decided to bottle-feed formula. Provided anticipatory guidance for breast care.  Maternal Data    Feeding Feeding Type: Bottle Fed - Formula  LATCH Score/Interventions                      Lactation Tools Discussed/Used     Consult Status Consult Status: Complete    Amber Moran Moran, 11:23 AM

## 2016-01-31 NOTE — Discharge Summary (Signed)
OB Discharge Summary  Patient Name: Amber Moran DOB: 29-Jan-1996 MRN: [id number]  Date of admission: 01/28/2016 Delivering MD: Trish Fountain E   Date of discharge: 01/31/2016  Admitting diagnosis: 40wks monitoring 6,8 bpp Intrauterine pregnancy: [redacted]w[redacted]d     Secondary diagnosis:Principal Problem:   SVD (spontaneous vaginal delivery) Active Problems:   Fetal heart rate nonreactive  Additional problems: None     Discharge diagnosis: Term Pregnancy Delivered                                                                     Post partum procedures:none  Augmentation: AROM, Pitocin and Foley Balloon  Complications: None  Hospital course:  Induction of Labor With Vaginal Delivery   20 y.o. yo G1P1001 at [redacted]w[redacted]d was admitted to the hospital 01/28/2016 for induction of labor.  Indication for induction: non-reactive tracing.  Patient had an uncomplicated labor course as follows: Membrane Rupture Time/Date: 5:00 PM ,01/29/2016   Intrapartum Procedures: Episiotomy: None [1]                                         Lacerations:  2nd degree [3]  Patient had delivery of a Viable infant.  Information for the patient's newborn:  Prisha, Hiley [[id number]]  Delivery Method: Vaginal, Spontaneous Delivery (Filed from Delivery Summary)   01/30/2016  Details of delivery can be found in separate delivery note.  Patient had a routine postpartum course. Patient is discharged home 01/31/2016.   Physical exam  Filed Vitals:   01/30/16 0347 01/30/16 0410 01/30/16 0514 01/31/16 0639  BP: 106/53 104/58 94/53 94/66   Pulse: 99 91 91 89  Temp:  98 F (36.7 C) 98.3 F (36.8 C) 98.2 F (36.8 C)  TempSrc:  Oral Oral Oral  Resp: Height:      Weight:      SpO2:  99% 97%    General: alert, cooperative and no distress Lochia: appropriate Uterine Fundus: firm Incision: N/A DVT Evaluation: No evidence of DVT seen on physical exam. Labs: Lab Results  Component Value Date   WBC 13.6* 01/28/2016    HGB 13.7 01/28/2016   HCT 39.4 01/28/2016   MCV 87.0 01/28/2016   PLT 234 01/28/2016   CMP Latest Ref Rng 10/06/2012  Glucose 70 - 99 mg/dL 83  BUN 6 - 23 mg/dL 8  Creatinine 7.82 - 9.56 mg/dL 2.13  Sodium 086 - 578 mEq/L 139  Potassium 3.5 - 5.1 mEq/L 3.7  Chloride 96 - 112 mEq/L 102  CO2 19 - 32 mEq/L 24  Calcium 8.4 - 10.5 mg/dL 9.2    Discharge instruction: per After Visit Summary and "Baby and Me Booklet".  After Visit Meds:    Medication List    STOP taking these medications        sucralfate 1 GM/10ML suspension  Commonly known as:  CARAFATE      TAKE these medications        ibuprofen 600 MG tablet  Commonly known as:  ADVIL,MOTRIN  Take 1 tablet (600 mg total) by mouth every 6 (six) hours.     omeprazole 20  MG capsule  Commonly known as:  PRILOSEC  Take 1 capsule (20 mg total) by mouth daily.     prenatal multivitamin Tabs tablet  Take 1 tablet by mouth daily at 12 noon.        Diet: routine diet  Activity: Advance as tolerated. Pelvic rest for 6 weeks.   Outpatient follow up:6 weeks Follow up Appt:No future appointments. Follow up visit: No Follow-up on file.  Postpartum contraception: IUD  Newborn Data: Live born female  Birth Weight: 5 lb 10.5 oz (2565 g) APGAR: 7, 8  Baby Feeding: Bottle Disposition:home with mother   01/31/2016 Hilton SinclairKaty D Mayo, MD   I have seen and examined this patient and agree the above assessment.  Respiratory effort normal, lochia appropriate, legs negative,  pain level normal.  CRESENZO-DISHMAN,Milan Perkins 02/05/2016 10:17 AM

## 2016-01-31 NOTE — Discharge Instructions (Signed)

## 2016-02-04 ENCOUNTER — Inpatient Hospital Stay (HOSPITAL_COMMUNITY): Payer: Medicaid Other

## 2017-12-24 IMAGING — US US MFM FETAL BPP W/O NON-STRESS
1 series · 15 of 24 positions shown · non-contrast
Comparison: none

[Series 1: us mfm fetal bpp w/o non-stress · 24 acquisitions, 15 frames shown]
[im 1/24]
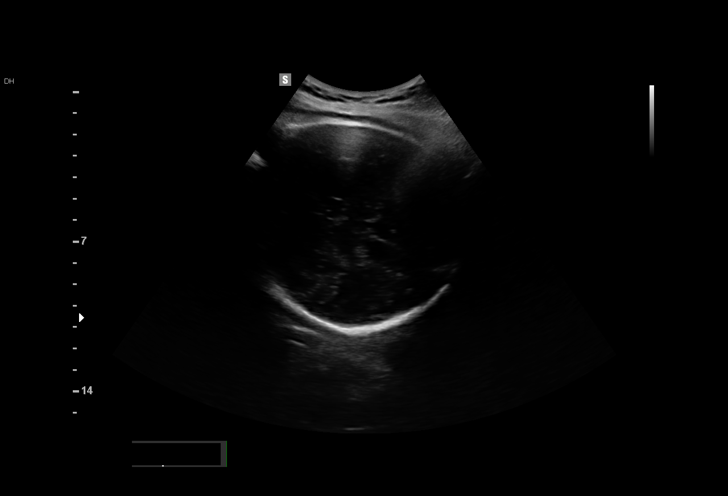
[im 3/24]
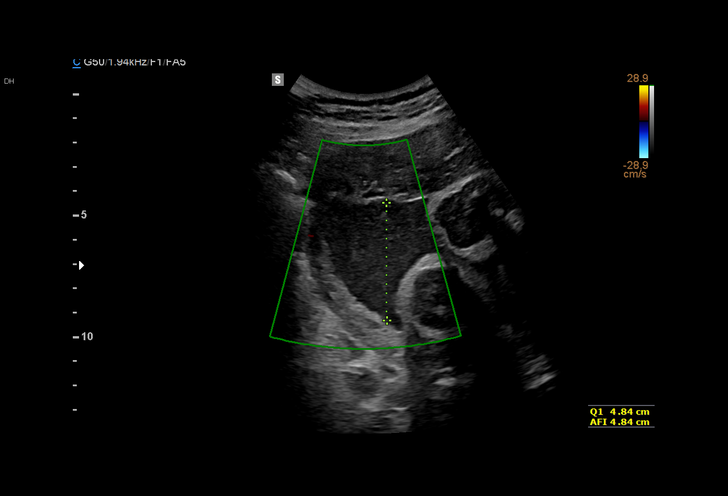
[im 5/24]
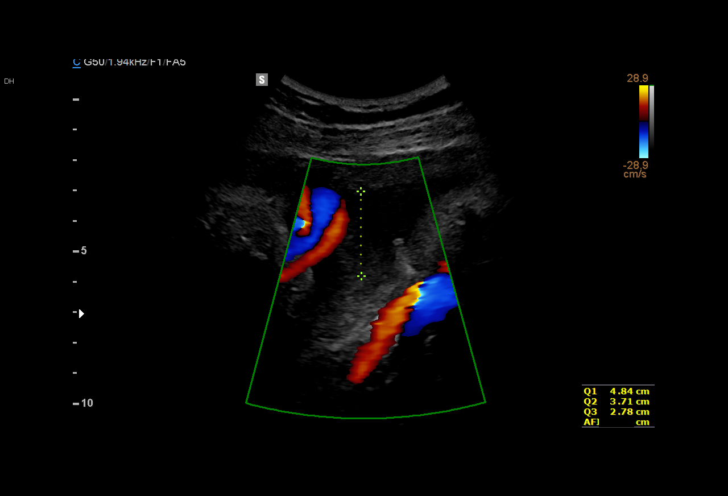
[im 6/24]
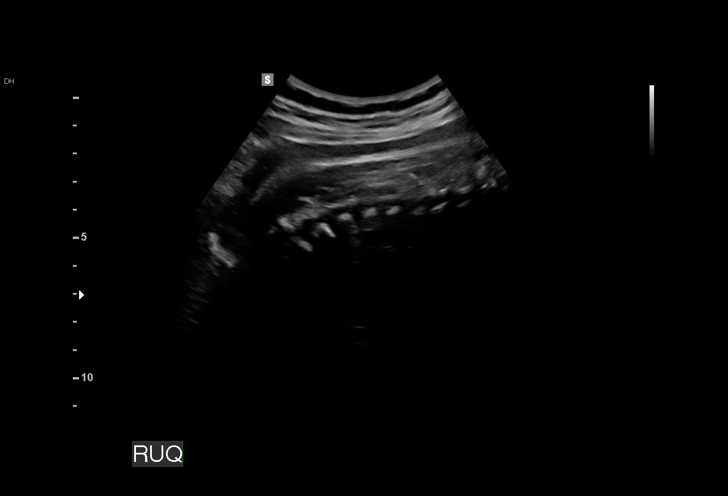
[im 8/24]
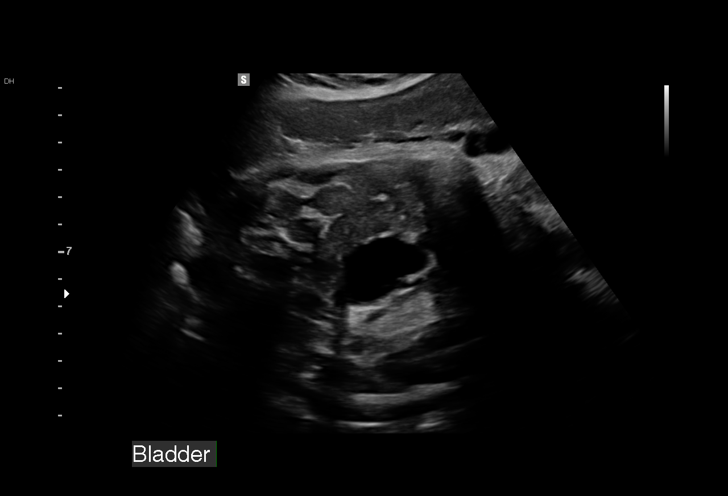
[im 9/24]
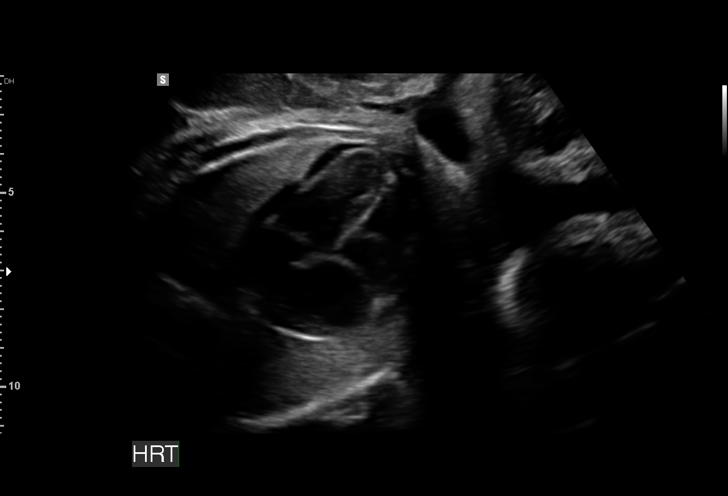
[im 11/24]
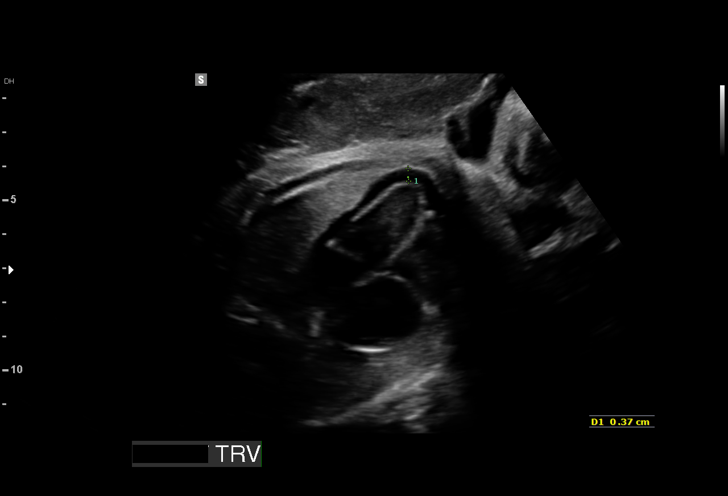
[im 13/24]
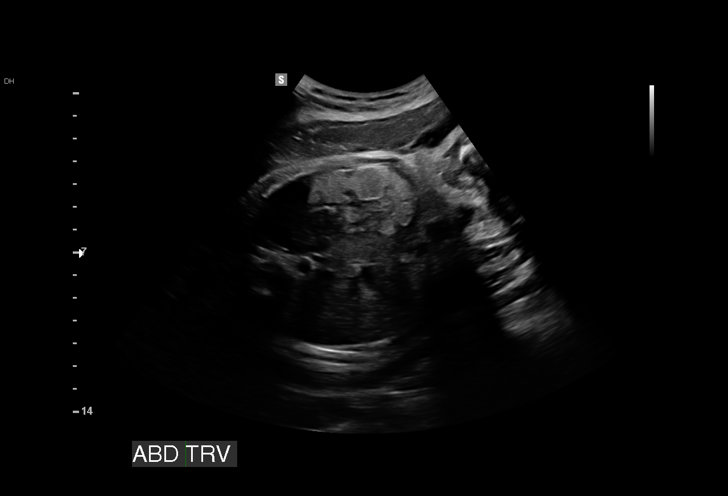
[im 14/24]
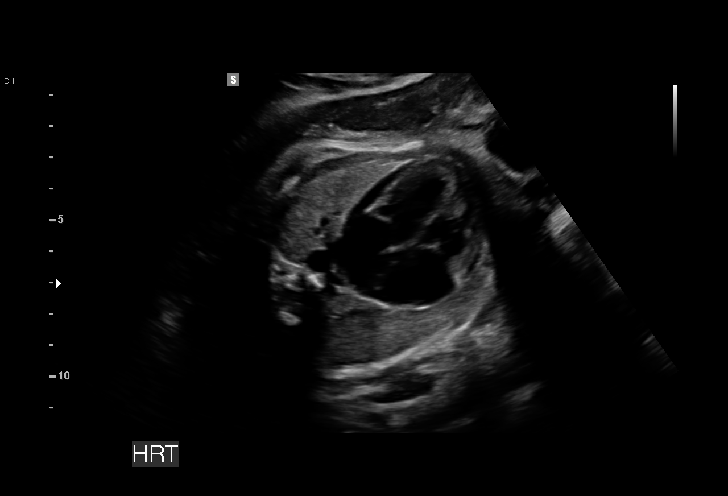
[im 16/24]
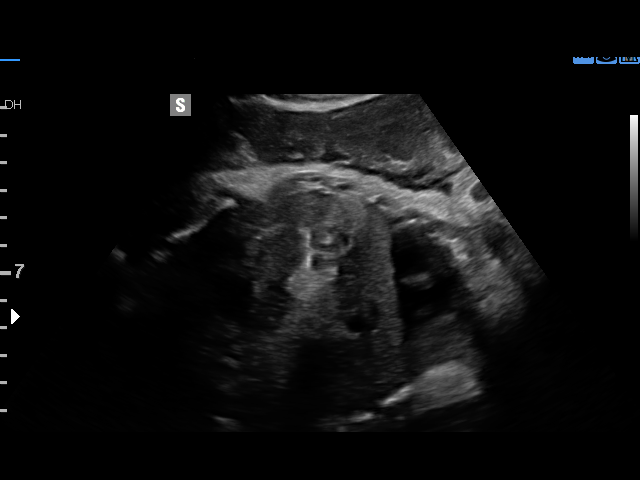
[im 17/24]
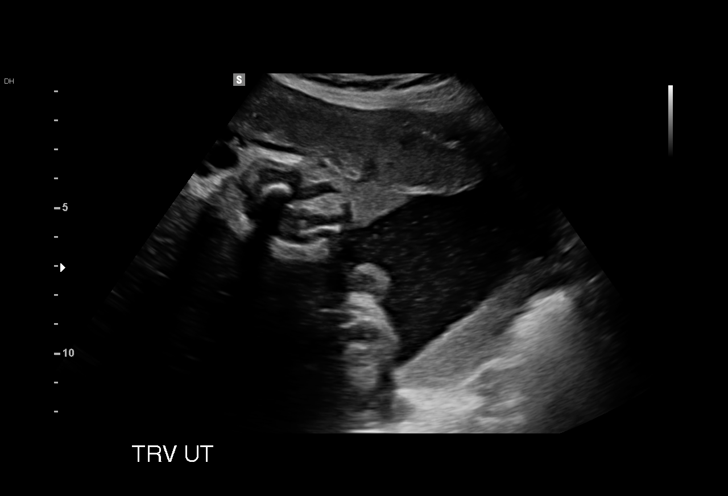
[im 19/24]
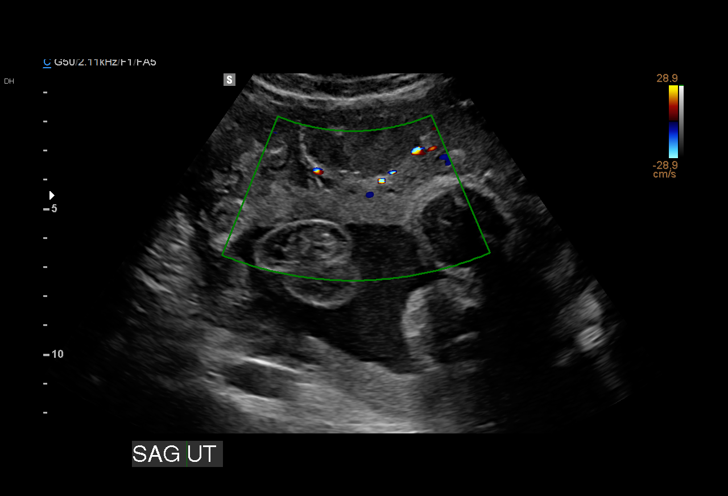
[im 21/24]
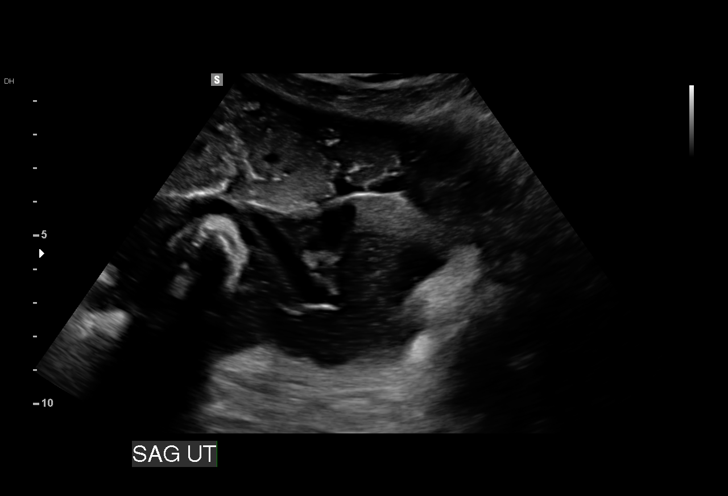
[im 22/24]
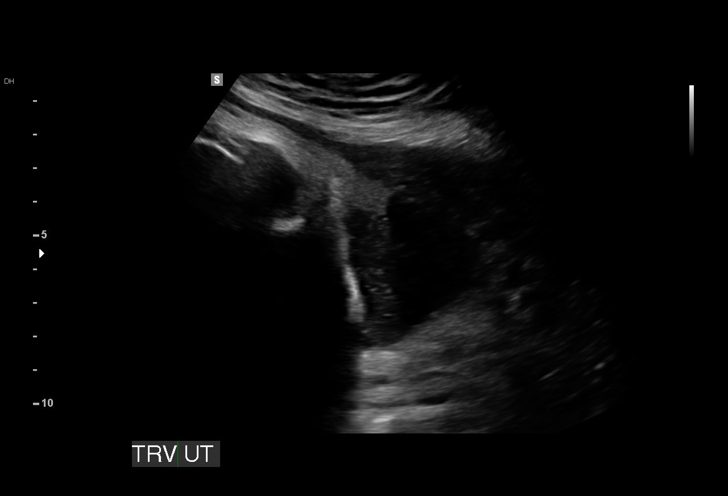
[im 24/24]
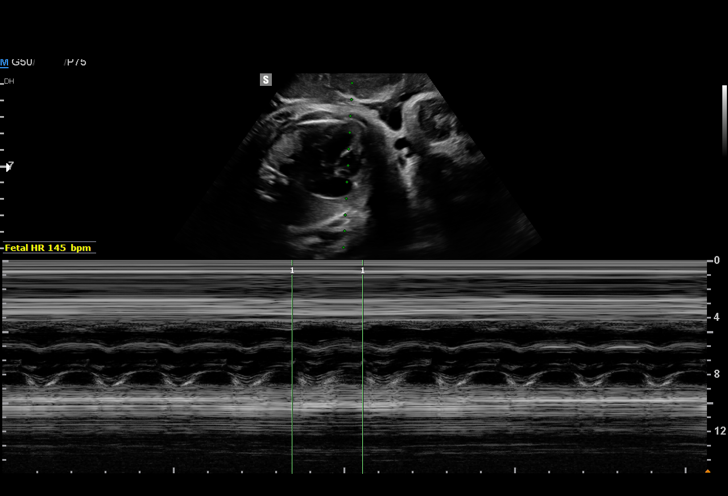

[15 of 24 positions shown; findings below may reference images not displayed]

[REDACTED]-
Faculty Physician

1  FERIENHAUS ERXLEBEN             [id number]       [id number]     [id number]
Indications

40 weeks gestation of pregnancy
Postdate pregnancy (40-42 weeks)
Abnormal biochemical screen (quad) for
Trisomy 21 (DSR [DATE]) - declined further
testing
OB History

Gravidity:    1
Fetal Evaluation

Num Of Fetuses:     1
Fetal Heart         150
Rate(bpm):
Cardiac Activity:   Observed
Presentation:       Cephalic

Amniotic Fluid
AFI FV:      Subjectively within normal limits

AFI Sum(cm)     %Tile       Largest Pocket(cm)
11.33           42

RUQ(cm)       RLQ(cm)       LUQ(cm)        LLQ(cm)
4.84          0
Biophysical Evaluation

Amniotic F.V:   Within normal limits       F. Tone:        Observed
F. Movement:    Observed                   Score:          [DATE]
F. Breathing:   Not Observed
Gestational Age

LMP:           40w 0d       Date:   04/23/15                 EDD:   01/28/16
Best:          40w 0d    Det. By:   LMP  (04/23/15)          EDD:   01/28/16
Impression

SIUP at 40+0 weeks
Cephalic presentation
Normal amniotic fluid volume
BPP [DATE] (-2 for no BM)
Recommendations

To [facility]  for further monitoring

## 2020-09-09 ENCOUNTER — Ambulatory Visit (INDEPENDENT_AMBULATORY_CARE_PROVIDER_SITE_OTHER): Payer: Medicaid Other

## 2020-09-09 ENCOUNTER — Other Ambulatory Visit: Payer: Self-pay

## 2020-09-09 VITALS — BP 103/68 | HR 89 | Wt 125.2 lb

## 2020-09-09 DIAGNOSIS — O3680X0 Pregnancy with inconclusive fetal viability, not applicable or unspecified: Secondary | ICD-10-CM | POA: Diagnosis not present

## 2020-09-09 DIAGNOSIS — Z3491 Encounter for supervision of normal pregnancy, unspecified, first trimester: Secondary | ICD-10-CM

## 2020-09-09 NOTE — Progress Notes (Signed)
Pt here today to schedule prenatal care. Front office reviewed pt history with RN Diane who recommends pt be seen by nurse today. Pt reports LMP of 07/09/20; reports this was a normal period lasting until 07/12/20. Reports positive home UPT on 08/08/20. Light bleeding 11/25, changed pad twice on that day. Pt went to urgent care for pregnancy confirmation on 08/27/20 and had beta HCG of 40. Reviewed dating with pt; pt is 8w 6d today with EDD of 04/15/21 based on LMP.   Reviewed recent history with Marsala, MD who comes to exam room for introduction; pt has not been seen in our practice since 2017. Marsala recommends dating/viability [place] prior to beginning prenatal care. [place] scheduled for first available appt on 09/23/20 at 0900; pt instructed to arrive 15 minutes early with a full bladder. Explained to pt she will be called with results by provider. Front office to schedule pt for new OB appts following [place]. Ectopic/miscarriage precautions reviewed; MAU instructions provided in AVS.  Fleet Contras RN 09/09/20

## 2020-09-10 NOTE — Progress Notes (Signed)
I agree with the care plan as documented in the RN note.    Randie Tallarico, MD OB Family Medicine Fellow, [facility], [facility]  

## 2020-09-17 ENCOUNTER — Other Ambulatory Visit: Payer: Self-pay

## 2020-09-17 ENCOUNTER — Inpatient Hospital Stay (HOSPITAL_COMMUNITY)
Admission: AD | Admit: 2020-09-17 | Discharge: 2020-09-17 | Disposition: A | Discharge status: Home / Self Care | Payer: Medicaid Other | Attending: Obstetrics and Gynecology | Admitting: Obstetrics and Gynecology

## 2020-09-17 DIAGNOSIS — N939 Abnormal uterine and vaginal bleeding, unspecified: Secondary | ICD-10-CM | POA: Insufficient documentation

## 2020-09-17 DIAGNOSIS — Z3202 Encounter for pregnancy test, result negative: Secondary | ICD-10-CM | POA: Diagnosis not present

## 2020-09-17 LAB — POCT PREGNANCY, URINE: Preg Test, Ur: NEGATIVE

## 2020-09-17 LAB — CBC
HCT: 43.7 % (ref 36.0–46.0)
Hemoglobin: 14.3 g/dL (ref 12.0–15.0)
MCH: 28.5 pg (ref 26.0–34.0)
MCHC: 32.7 g/dL (ref 30.0–36.0)
MCV: 87.1 fL (ref 80.0–100.0)
Platelets: 286 10*3/uL (ref 150–400)
RBC: 5.02 MIL/uL (ref 3.87–5.11)
RDW: 11.9 % (ref 11.5–15.5)
WBC: 6.3 10*3/uL (ref 4.0–10.5)
nRBC: 0 % (ref 0.0–0.2)

## 2020-09-17 LAB — HCG, QUANTITATIVE, PREGNANCY: hCG, Beta Chain, Quant, S: 1 m[IU]/mL (ref <5)

## 2020-09-17 NOTE — Discharge Instructions (Signed)
Human Chorionic Gonadotropin Test Why am I having this test? A human chorionic gonadotropin (hCG) test is done to determine whether you are pregnant. It can also be used:  To diagnose an abnormal pregnancy.  To determine whether you have had a failed pregnancy (miscarriage) or are at risk of one. What is being tested? This test checks the level of the human chorionic gonadotropin (hCG) hormone in the blood. This hormone is produced during pregnancy by the cells that form the placenta. The placenta is the organ that grows inside your womb (uterus) to nourish a developing baby. When you are pregnant, hCG can be detected in your blood or urine 7 to 8 days before your missed period. It continues to go up for the first 8-10 weeks of pregnancy. The presence of hCG in your blood can be measured with several different types of tests. You may have:  A urine test. ? Because this hormone is eliminated from your body by your kidneys, you may have a urine test to find out whether you are pregnant. A home pregnancy test detects whether there is hCG in your urine. ? A urine test only shows whether there is hCG in your urine. It does not measure how much.  A qualitative blood test. ? You may have this type of blood test to find out if you are pregnant. ? This blood test only shows whether there is hCG in your blood. It does not measure how much.  A quantitative blood test. ? This type of blood test measures the amount of hCG in your blood. ? You may have this test to:  Diagnose an abnormal pregnancy.  Check whether you have had a miscarriage.  Determine whether you are at risk of a miscarriage. What kind of sample is taken?     Two kinds of samples may be collected to test for the hCG hormone.  Blood. It is usually collected by inserting a needle into a blood vessel.  Urine. It is usually collected by urinating into a germ-free (sterile) specimen cup. It is best to collect the sample the first  time you urinate in the morning. How do I prepare for this test? No preparation is needed for a blood test.  For the urine test:  Let your health care provider know about: ? All medicines you are taking, including vitamins, herbs, creams, and over-the-counter medicines. ? Any blood in your urine. This may interfere with the result.  Do not drink too much fluid. Drink as you normally would, or as directed by your health care provider. How are the results reported? Depending on the type of test that you have, your test results may be reported as values. Your health care provider will compare your results to normal ranges that were established after testing a large group of people (reference ranges). Reference ranges may vary among labs and hospitals. For this test, common reference ranges that show absence of pregnancy are:  Quantitative hCG blood levels: less than 5 IU/L. Other results will be reported as either positive or negative. For this test, normal results (meaning the absence of pregnancy) are:  Negative for hCG in the urine test.  Negative for hCG in the qualitative blood test. What do the results mean? Urine and qualitative blood test  A negative result could mean: ? That you are not pregnant. ? That the test was done too early in your pregnancy to detect hCG in your blood or urine. If you still have other signs   of pregnancy, the test will be repeated.  A positive result means: ? That you are most likely pregnant. Your health care provider may confirm your pregnancy with an imaging study (ultrasound) of your uterus, if needed. Quantitative blood test Results of the quantitative hCG blood test will be interpreted as follows:  Less than 5 IU/L: You are most likely not pregnant.  Greater than 25 IU/L: You are most likely pregnant.  hCG levels that are higher than expected: ? You are pregnant with twins. ? You have abnormal growths in the uterus.  hCG levels that are  rising more slowly than expected: ? You have an ectopic pregnancy (also called a tubal pregnancy).  hCG levels that are falling: ? You may be having a miscarriage. Talk with your health care provider about what your results mean. Questions to ask your health care provider Ask your health care provider, or the department that is doing the test:  When will my results be ready?  How will I get my results?  What are my treatment options?  What other tests do I need?  What are my next steps? Summary  A human chorionic gonadotropin test is done to determine whether you are pregnant.  When you are pregnant, hCG can be detected in your blood or urine 7 to 8 days before your missed period. It continues to go up for the first 8-10 weeks of pregnancy.  Your hCG level can be measured with different types of tests. You may have a urine test, a qualitative blood test, or a quantitative blood test.  Talk with your health care provider about what your results mean. This information is not intended to replace advice given to you by your health care provider. Make sure you discuss any questions you have with your health care provider. Document Revised: 08/16/2017 Document Reviewed: 08/16/2017 Elsevier Patient Education  2020 [employer]vier Inc.  

## 2020-09-17 NOTE — MAU Provider Note (Signed)
Event Date/Time  First Provider Initiated Contact with Patient 09/17/20 1052     S Ms. Amber Moran is a 24 y.o. G2P1001 patient who presents to MAU today with complaint of vaginal bleeding.  This is a new problem, onset yesterday.  Patient endorses positive home pregnancy test and a Quant hCG of 40 at Urgent Care in early December. She contacted [facility] to schedule a New OB appointment and was advised to have a viability scan first. That scan is scheduled for 09/23/2020.  O BP 109/67   Pulse 90   Temp 98.6 F (37 C)   Resp 16   Ht 5\' 2"  (1.575 m)   Wt 55.8 kg   LMP 08/22/2020   SpO2 100%   BMI 22.50 kg/m    Physical Exam Vitals and nursing note reviewed. Exam conducted with a chaperone present.  Constitutional:      General: She is not in acute distress.    Appearance: She is not ill-appearing.  Cardiovascular:     Rate and Rhythm: Normal rate.     Pulses: Normal pulses.  Pulmonary:     Effort: Pulmonary effort is normal.  Skin:    Capillary Refill: Capillary refill takes less than 2 seconds.  Neurological:     Mental Status: She is alert and oriented to person, place, and time.  Psychiatric:        Mood and Affect: Mood normal.    A Medical screening exam complete Quant hCG of 40 at Urgent Care at the beginning of December Negative urine pregnancy test in MAU Serum Quant hCG 1 Discussed with patient this is not consistent with recent miscarriage   P Discharge from MAU in stable condition Per patient request, messages sent to cancel office and ultrasound appointments Patient encouraged to schedule follow-up in office  January, CNM 09/17/2020 2:57 PM

## 2020-09-17 NOTE — MAU Note (Signed)
.   Amber Moran is a 24 y.o. at Unknown here in MAU reporting: she started having vaginal bleeding last night. Reports + pregnancy test at urgent care the beginning of December.Denies any pain LMP: 08/22/2020 Onset of complaint:  Pain score: 0 Vitals:   09/17/20 1046 09/17/20 1047  BP:  109/67  Pulse:  90  Resp: 16   Temp: 98.6 F (37 C)   SpO2:  100%     FHT: Lab orders placed from triage: UPT

## 2020-09-23 ENCOUNTER — Ambulatory Visit: Admission: RE | Admit: 2020-09-23 | Payer: Medicaid Other | Source: Ambulatory Visit

## 2020-10-08 ENCOUNTER — Encounter: Payer: Medicaid Other | Admitting: Advanced Practice Midwife

## 2021-03-12 ENCOUNTER — Ambulatory Visit (INDEPENDENT_AMBULATORY_CARE_PROVIDER_SITE_OTHER): Payer: Medicaid Other | Admitting: General Practice

## 2021-03-12 DIAGNOSIS — Z3201 Encounter for pregnancy test, result positive: Secondary | ICD-10-CM | POA: Diagnosis not present

## 2021-03-12 LAB — POCT PREGNANCY, URINE: Preg Test, Ur: POSITIVE — AB

## 2021-03-12 MED ORDER — PRENATAL PLUS 27-1 MG PO TABS: 1.0000 | ORAL_TABLET | Freq: Every day | ORAL | 11 refills | Status: DC

## 2021-03-12 NOTE — Progress Notes (Signed)
Patient was assessed and managed by nursing staff during this encounter. I have reviewed the chart and agree with the documentation and plan.   Bernerd Limbo, CNM 03/12/2021 12:22 PM

## 2021-03-12 NOTE — Progress Notes (Signed)
Patient came by office and dropped off urine sample for UPT. UPT +.  Called patient at home and she reports first positive home test 5/26. LMP 01/21/21 EDD 10/28/21 [redacted]w[redacted]d. Reviewed allergies and medications with patient. Patient requests Rx for PNV. Rx sent to pharmacy per protocol. Patient denies nausea/vomiting. She requests to start [facility] care with our office. Told patient someone from our front office will reach out to her regarding setting up initial prenatal visit. Patient verbalized understanding.  [name] RN BSN 03/12/21

## 2021-04-28 ENCOUNTER — Other Ambulatory Visit: Payer: Self-pay

## 2021-04-28 ENCOUNTER — Ambulatory Visit (INDEPENDENT_AMBULATORY_CARE_PROVIDER_SITE_OTHER): Payer: Medicaid Other | Admitting: Obstetrics and Gynecology

## 2021-04-28 ENCOUNTER — Other Ambulatory Visit (HOSPITAL_COMMUNITY)
Admission: RE | Admit: 2021-04-28 | Discharge: 2021-04-28 | Disposition: A | Discharge status: Home / Self Care | Payer: Medicaid Other | Source: Ambulatory Visit | Attending: Obstetrics and Gynecology | Admitting: Obstetrics and Gynecology

## 2021-04-28 ENCOUNTER — Encounter: Payer: Self-pay | Admitting: Obstetrics and Gynecology

## 2021-04-28 VITALS — BP 91/65 | HR 88 | Wt 122.9 lb

## 2021-04-28 DIAGNOSIS — Z3491 Encounter for supervision of normal pregnancy, unspecified, first trimester: Secondary | ICD-10-CM

## 2021-04-28 DIAGNOSIS — Z3490 Encounter for supervision of normal pregnancy, unspecified, unspecified trimester: Secondary | ICD-10-CM | POA: Insufficient documentation

## 2021-04-28 MED ORDER — BLOOD PRESSURE KIT DEVI: 1.0000 | 0 refills | Status: DC | PRN

## 2021-04-28 NOTE — Progress Notes (Signed)
co  INITIAL PRENATAL VISIT NOTE  Subjective:  Amber Moran is a 25 y.o. G2P1001 at 54w6dby LMP being seen today for her initial prenatal visit.  She has an obstetric history significant for 1 term SVD. She has a medical history significant for n/a.  Patient reports no complaints.  Contractions: Not present. Vag. Bleeding: None.  Movement: Absent. Denies leaking of fluid.    Past Medical History:  Diagnosis Date   Medical history non-contributory     Past Surgical History:  Procedure Laterality Date   NO PAST SURGERIES      OB History  Gravida Para Term Preterm AB Living  2 1 1  0 0 1  SAB IAB Ectopic Multiple Live Births  0 0 0 0 1    # Outcome Date GA Lbr Len/2nd Weight Sex Delivery Anes PTL Lv  2 Current           1 Term 01/30/16 49w2d8:12 / 01:59 5 lb 10.5 oz (2.565 kg) M Vag-Spont EPI  LIV    Social History   Socioeconomic History   Marital status: Married    Spouse name: Not on file   Number of children: Not on file   Years of education: Not on file   Highest education level: Not on file  Occupational History   Not on file  Tobacco Use   Smoking status: Never   Smokeless tobacco: Never  Vaping Use   Vaping Use: Never used  Substance and Sexual Activity   Alcohol use: No   Drug use: No   Sexual activity: Yes  Other Topics Concern   Not on file  Social History Narrative   Not on file   Social Determinants of Health   Financial Resource Strain: Not on file  Food Insecurity: No Food Insecurity   Worried About Running Out of Food in the Last Year: Never true   Ran Out of Food in the Last Year: Never true  Transportation Needs: No Transportation Needs   Lack of Transportation (Medical): No   Lack of Transportation (Non-Medical): No  Physical Activity: Not on file  Stress: Not on file  Social Connections: Not on file    Family History  Family history unknown: Yes     Current Outpatient Medications:    Blood Pressure Monitoring (BLOOD PRESSURE  KIT) DEVI, 1 Device by Does not apply route as needed., Disp: 1 each, Rfl: 0   prenatal vitamin w/FE, FA (PRENATAL 1 + 1) 27-1 MG TABS tablet, Take 1 tablet by mouth daily at 12 noon., Disp: 30 tablet, Rfl: 11  No Known Allergies  Review of Systems: Negative except for what is mentioned in HPI.  Objective:   Vitals:   04/28/21 1546  BP: 91/65  Pulse: 88  Weight: 122 lb 14.4 oz (55.7 kg)    Fetal Status: Fetal Heart Rate (bpm): 166   Movement: Absent     Physical Exam: BP 91/65   Pulse 88   Wt 122 lb 14.4 oz (55.7 kg)   LMP 01/21/2021   BMI 22.48 kg/m  CONSTITUTIONAL: Well-developed, well-nourished female in no acute distress.  NEUROLOGIC: Alert and oriented to person, place, and time. Normal reflexes, muscle tone coordination. No cranial nerve deficit noted. PSYCHIATRIC: Normal mood and affect. Normal behavior. Normal judgment and thought content. SKIN: Skin is warm and dry. No rash noted. Not diaphoretic. No erythema. No pallor. HENT:  Normocephalic, atraumatic, External right and left ear normal. Oropharynx is clear and moist EYES: Conjunctivae and  EOM are normal. Pupils are equal, round, and reactive to light. No scleral icterus.  NECK: Normal range of motion, supple, no masses CARDIOVASCULAR: Normal heart rate noted, regular rhythm RESPIRATORY: Effort and breath sounds normal, no problems with respiration noted BREASTS: deferred ABDOMEN: Soft, nontender, nondistended, gravid. GU: normal appearing external female genitalia, multiparous normal appearing cervix, thick white discharge in vagina, no lesions noted Bimanual: 14 weeks sized uterus, no adnexal tenderness or palpable lesions noted MUSCULOSKELETAL: Normal range of motion. EXT:  No edema and no tenderness. 2+ distal pulses.  Pt informed that the ultrasound is considered a limited OB ultrasound and is not intended to be a complete ultrasound exam.  Patient also informed that the ultrasound is not being completed with  the intent of assessing for fetal or placental anomalies or any pelvic abnormalities.  Explained that the purpose of today's ultrasound is to assess for  viability.  Patient acknowledges the purpose of the exam and the limitations of the study.    Bedside US: FHR 166 bpm, active fetal movement noted  Assessment and Plan:  Pregnancy: G2P1001 at 72w6dby LMP  1. Supervision of low-risk pregnancy, first trimester - [place], multiple providers, fellows, medical students, virtual visits, MyChart.  - Cytology - PAP( [place]) - Genetic Screening - Hemoglobin A1c - Culture, OB Urine - CBC/D/Plt+RPR+Rh+ABO+RubIgG... - [place] OB COMP + 14 WK; Future - Cervicovaginal ancillary only( [place])   Preterm labor symptoms and general obstetric precautions including but not limited to vaginal bleeding, contractions, leaking of fluid and fetal movement were reviewed in detail with the patient.  Please refer to After Visit Summary for other counseling recommendations.   Return in about 4 weeks (around 05/26/2021) for low OB, in person.  KSloan Leiter8/09/2020 4:24 PM

## 2021-04-28 NOTE — Addendum Note (Signed)
Addended byVidal Schwalbe on: 04/28/2021 04:49 PM   Modules accepted: Orders

## 2021-04-29 LAB — CBC/D/PLT+RPR+RH+ABO+RUBIGG...
Antibody Screen: NEGATIVE
Basophils Absolute: 0 10*3/uL (ref 0.0–0.2)
Basos: 0 %
EOS (ABSOLUTE): 0.1 10*3/uL (ref 0.0–0.4)
Eos: 1 %
HCV Ab: <0.1 s/co ratio (ref 0.0–0.9)
HIV Screen 4th Generation wRfx: NONREACTIVE
Hematocrit: 40.6 % (ref 34.0–46.6)
Hemoglobin: 13 g/dL (ref 11.1–15.9)
Hepatitis B Surface Ag: NEGATIVE
Immature Grans (Abs): 0 10*3/uL (ref 0.0–0.1)
Immature Granulocytes: 0 %
Lymphocytes Absolute: 1.5 10*3/uL (ref 0.7–3.1)
Lymphs: 15 %
MCH: 28 pg (ref 26.6–33.0)
MCHC: 32 g/dL (ref 31.5–35.7)
MCV: 88 fL (ref 79–97)
Monocytes Absolute: 0.5 10*3/uL (ref 0.1–0.9)
Monocytes: 5 %
Neutrophils Absolute: 7.9 10*3/uL — ABNORMAL HIGH (ref 1.4–7.0)
Neutrophils: 79 %
Platelets: 271 10*3/uL (ref 150–450)
RBC: 4.64 x10E6/uL (ref 3.77–5.28)
RDW: 12.9 % (ref 11.7–15.4)
RPR Ser Ql: NONREACTIVE
Rh Factor: POSITIVE
Rubella Antibodies, IGG: 9.58 index (ref >0.99)
WBC: 9.9 10*3/uL (ref 3.4–10.8)

## 2021-04-29 LAB — HEMOGLOBIN A1C
Est. average glucose Bld gHb Est-mCnc: 100 mg/dL
Hgb A1c MFr Bld: 5.1 % (ref 4.8–5.6)

## 2021-04-29 LAB — CYTOLOGY - PAP
Chlamydia: NEGATIVE
Comment: NEGATIVE
Comment: NEGATIVE
Comment: NORMAL
Diagnosis: NEGATIVE
Diagnosis: REACTIVE
High risk HPV: NEGATIVE
Neisseria Gonorrhea: NEGATIVE

## 2021-04-29 LAB — CERVICOVAGINAL ANCILLARY ONLY
Bacterial Vaginitis (gardnerella): NEGATIVE
Candida Glabrata: NEGATIVE
Candida Vaginitis: POSITIVE — AB
Comment: NEGATIVE
Comment: NEGATIVE
Comment: NEGATIVE

## 2021-04-29 LAB — HCV INTERPRETATION

## 2021-04-30 ENCOUNTER — Encounter: Payer: Self-pay | Admitting: *Deleted

## 2021-04-30 LAB — CULTURE, OB URINE

## 2021-04-30 LAB — URINE CULTURE, OB REFLEX

## 2021-05-01 MED ORDER — CLOTRIMAZOLE 1 % VA CREA: 1.0000 | TOPICAL_CREAM | Freq: Every day | VAGINAL | 2 refills | Status: DC

## 2021-05-01 NOTE — Addendum Note (Signed)
Addended by: Leroy Libman on: 05/01/2021 10:43 AM   Modules accepted: Orders

## 2021-05-14 ENCOUNTER — Encounter: Payer: Self-pay | Admitting: General Practice

## 2021-06-03 ENCOUNTER — Other Ambulatory Visit: Payer: Self-pay

## 2021-06-03 ENCOUNTER — Ambulatory Visit: Payer: Medicaid Other | Attending: Obstetrics and Gynecology

## 2021-06-03 DIAGNOSIS — Z3491 Encounter for supervision of normal pregnancy, unspecified, first trimester: Secondary | ICD-10-CM

## 2021-06-05 ENCOUNTER — Other Ambulatory Visit: Payer: Self-pay

## 2021-06-05 ENCOUNTER — Other Ambulatory Visit (HOSPITAL_COMMUNITY)
Admission: RE | Admit: 2021-06-05 | Discharge: 2021-06-05 | Disposition: A | Discharge status: Home / Self Care | Payer: Medicaid Other | Source: Ambulatory Visit | Attending: Student | Admitting: Student

## 2021-06-05 ENCOUNTER — Other Ambulatory Visit: Payer: Self-pay | Admitting: Student

## 2021-06-05 ENCOUNTER — Ambulatory Visit (INDEPENDENT_AMBULATORY_CARE_PROVIDER_SITE_OTHER): Payer: Medicaid Other | Admitting: Student

## 2021-06-05 VITALS — BP 96/63 | HR 79 | Wt 126.4 lb

## 2021-06-05 DIAGNOSIS — N898 Other specified noninflammatory disorders of vagina: Secondary | ICD-10-CM | POA: Insufficient documentation

## 2021-06-05 DIAGNOSIS — O26892 Other specified pregnancy related conditions, second trimester: Secondary | ICD-10-CM | POA: Insufficient documentation

## 2021-06-05 DIAGNOSIS — Z3A19 19 weeks gestation of pregnancy: Secondary | ICD-10-CM

## 2021-06-05 DIAGNOSIS — Z3491 Encounter for supervision of normal pregnancy, unspecified, first trimester: Secondary | ICD-10-CM

## 2021-06-05 MED ORDER — TERCONAZOLE 0.4 % VA CREA: 1.0000 | TOPICAL_CREAM | Freq: Every day | VAGINAL | 0 refills | Status: DC

## 2021-06-05 NOTE — Progress Notes (Signed)
Pt states had yeast infection and used OTC Monistat but did not resolve symptoms completely. Pt states having continued thick, white discharge with odor, denies itching.

## 2021-06-05 NOTE — Progress Notes (Signed)
   PRENATAL VISIT NOTE  Subjective:  Amber Moran is a 25 y.o. G2P1001 at [redacted]w[redacted]d being seen today for ongoing prenatal care.  She is currently monitored for the following issues for this low-risk pregnancy and has SVD (spontaneous vaginal delivery) and Supervision of low-risk pregnancy, first trimester on their problem list.  Patient reports vaginal irritation. She thinks she has a yeast infection due to clumpy discharge and irritation; she tried monistat and it got better but she still has some discharge.  Contractions: Not present. Vag. Bleeding: None.  Movement: Present. Denies leaking of fluid.   The following portions of the patient's history were reviewed and updated as appropriate: allergies, current medications, past family history, past medical history, past social history, past surgical history and problem list.   Objective:   Vitals:   06/05/21 1035  BP: 96/63  Pulse: 79  Weight: 126 lb 6.4 oz (57.3 kg)    Fetal Status: Fetal Heart Rate (bpm): 155   Movement: Present     General:  Alert, oriented and cooperative. Patient is in no acute distress.  Skin: Skin is warm and dry. No rash noted.   Cardiovascular: Normal heart rate noted  Respiratory: Normal respiratory effort, no problems with respiration noted  Abdomen: Soft, gravid, appropriate for gestational age.  Pain/Pressure: Absent     Pelvic: Cervical exam deferred      NEFG; clumpy white discharge present in vagina with erythematous vaginal walls, no odor  Extremities: Normal range of motion.  Edema: None  Mental Status: Normal mood and affect. Normal behavior. Normal judgment and thought content.   Assessment and Plan:  Pregnancy: G2P1001 at [redacted]w[redacted]d 1. Supervision of low-risk pregnancy, first trimester  - AFP, Serum, Open Spina Bifida  2. Vaginal discharge during pregnancy in second trimester -RX for Terazole given, explained how to take - Cervicovaginal ancillary only( [place])  Preterm labor symptoms and general  obstetric precautions including but not limited to vaginal bleeding, contractions, leaking of fluid and fetal movement were reviewed in detail with the patient. Please refer to After Visit Summary for other counseling recommendations.   Return in about 4 weeks (around 07/03/2021), or LROB in 2 weeks.  Future Appointments  Date Time Provider Department Center  07/03/2021 10:35 AM Adam Phenix, MD [facility]    Marylene Land, CNM

## 2021-06-06 LAB — CERVICOVAGINAL ANCILLARY ONLY
Bacterial Vaginitis (gardnerella): NEGATIVE
Candida Glabrata: NEGATIVE
Candida Vaginitis: POSITIVE — AB
Chlamydia: NEGATIVE
Comment: NEGATIVE
Comment: NEGATIVE
Comment: NEGATIVE
Comment: NEGATIVE
Comment: NEGATIVE
Comment: NORMAL
Neisseria Gonorrhea: NEGATIVE
Trichomonas: NEGATIVE

## 2021-06-12 LAB — AFP, SERUM, OPEN SPINA BIFIDA
AFP MoM: 0.75
AFP Value: 46.8 ng/mL
Gest. Age on Collection Date: 19.3 weeks
Maternal Age At EDD: 28.6 yr
OSBR Risk 1 IN: 10000
Test Results:: NEGATIVE
Weight: 122 [lb_av]

## 2021-07-03 ENCOUNTER — Ambulatory Visit (INDEPENDENT_AMBULATORY_CARE_PROVIDER_SITE_OTHER): Payer: Medicaid Other | Admitting: Obstetrics & Gynecology

## 2021-07-03 ENCOUNTER — Other Ambulatory Visit (HOSPITAL_COMMUNITY)
Admission: RE | Admit: 2021-07-03 | Discharge: 2021-07-03 | Disposition: A | Discharge status: Home / Self Care | Payer: Medicaid Other | Source: Ambulatory Visit | Attending: Obstetrics & Gynecology | Admitting: Obstetrics & Gynecology

## 2021-07-03 ENCOUNTER — Other Ambulatory Visit: Payer: Self-pay

## 2021-07-03 VITALS — BP 110/68 | HR 95 | Wt 131.8 lb

## 2021-07-03 DIAGNOSIS — Z3491 Encounter for supervision of normal pregnancy, unspecified, first trimester: Secondary | ICD-10-CM | POA: Diagnosis not present

## 2021-07-03 DIAGNOSIS — Z3A23 23 weeks gestation of pregnancy: Secondary | ICD-10-CM

## 2021-07-03 NOTE — Progress Notes (Signed)
   PRENATAL VISIT NOTE  Subjective:  Amber Moran is a 25 y.o. G2P1001 at [redacted]w[redacted]d being seen today for ongoing prenatal care.  She is currently monitored for the following issues for this low-risk pregnancy and has Supervision of low-risk pregnancy on their problem list.  Patient reports vaginal irritation and discharge .  Contractions: Not present. Vag. Bleeding: None.  Movement: Present. Denies leaking of fluid.   The following portions of the patient's history were reviewed and updated as appropriate: allergies, current medications, past family history, past medical history, past social history, past surgical history and problem list.   Objective:   Vitals:   07/03/21 1036  BP: 110/68  Pulse: 95  Weight: 131 lb 12.8 oz (59.8 kg)    Fetal Status: Fetal Heart Rate (bpm): 154   Movement: Present     General:  Alert, oriented and cooperative. Patient is in no acute distress.  Skin: Skin is warm and dry. No rash noted.   Cardiovascular: Normal heart rate noted  Respiratory: Normal respiratory effort, no problems with respiration noted  Abdomen: Soft, gravid, appropriate for gestational age.  Pain/Pressure: Absent     Pelvic: Cervical exam deferred        Extremities: Normal range of motion.  Edema: None  Mental Status: Normal mood and affect. Normal behavior. Normal judgment and thought content.   Assessment and Plan:  Pregnancy: G2P1001 at 106w2d 1. Supervision of low-risk pregnancy, first trimester Self-swab - Cervicovaginal ancillary only( [place])  Preterm labor symptoms and general obstetric precautions including but not limited to vaginal bleeding, contractions, leaking of fluid and fetal movement were reviewed in detail with the patient. Please refer to After Visit Summary for other counseling recommendations.   Return in about 4 weeks (around 07/31/2021).  No future appointments.  Scheryl Darter, MD

## 2021-07-04 LAB — CERVICOVAGINAL ANCILLARY ONLY
Bacterial Vaginitis (gardnerella): NEGATIVE
Candida Glabrata: NEGATIVE
Candida Vaginitis: NEGATIVE
Chlamydia: NEGATIVE
Comment: NEGATIVE
Comment: NEGATIVE
Comment: NEGATIVE
Comment: NEGATIVE
Comment: NEGATIVE
Comment: NORMAL
Neisseria Gonorrhea: NEGATIVE
Trichomonas: NEGATIVE

## 2021-07-31 ENCOUNTER — Ambulatory Visit (INDEPENDENT_AMBULATORY_CARE_PROVIDER_SITE_OTHER): Payer: Medicaid Other

## 2021-07-31 ENCOUNTER — Other Ambulatory Visit: Payer: Self-pay

## 2021-07-31 ENCOUNTER — Other Ambulatory Visit: Payer: Self-pay | Admitting: *Deleted

## 2021-07-31 ENCOUNTER — Other Ambulatory Visit: Payer: Medicaid Other

## 2021-07-31 VITALS — BP 97/62 | HR 90 | Wt 133.7 lb

## 2021-07-31 DIAGNOSIS — Z3490 Encounter for supervision of normal pregnancy, unspecified, unspecified trimester: Secondary | ICD-10-CM

## 2021-07-31 DIAGNOSIS — Z3A27 27 weeks gestation of pregnancy: Secondary | ICD-10-CM

## 2021-07-31 DIAGNOSIS — Z23 Encounter for immunization: Secondary | ICD-10-CM | POA: Diagnosis not present

## 2021-07-31 NOTE — Progress Notes (Signed)
   PRENATAL VISIT NOTE  Subjective:  Amber Moran is a 25 y.o. G2P1001 at [redacted]w[redacted]d being seen today for ongoing prenatal care.  She is currently monitored for the following issues for this low-risk pregnancy and has Supervision of low-risk pregnancy on their problem list.  Patient reports no complaints.  Contractions: Not present. Vag. Bleeding: None.  Movement: Present. Denies leaking of fluid.   The following portions of the patient's history were reviewed and updated as appropriate: allergies, current medications, past family history, past medical history, past social history, past surgical history and problem list.   Objective:   Vitals:   07/31/21 0957  BP: 97/62  Pulse: 90  Weight: 133 lb 11.2 oz (60.6 kg)    Fetal Status: Fetal Heart Rate (bpm): 162 Fundal Height: 27 cm Movement: Present     General:  Alert, oriented and cooperative. Patient is in no acute distress.  Skin: Skin is warm and dry. No rash noted.   Cardiovascular: Normal heart rate noted  Respiratory: Normal respiratory effort, no problems with respiration noted  Abdomen: Soft, gravid, appropriate for gestational age.  Pain/Pressure: Absent     Pelvic: Cervical exam deferred        Extremities: Normal range of motion.  Edema: None  Mental Status: Normal mood and affect. Normal behavior. Normal judgment and thought content.   Assessment and Plan:  Pregnancy: G2P1001 at [redacted]w[redacted]d 1. Encounter for supervision of low-risk pregnancy, antepartum - Routine OB care. GTT and 28 weeks labs - Tdap given today - No concerns - Anticipatory guidance for upcoming appointments provided  2. [redacted] weeks gestation of pregnancy    Preterm labor symptoms and general obstetric precautions including but not limited to vaginal bleeding, contractions, leaking of fluid and fetal movement were reviewed in detail with the patient. Please refer to After Visit Summary for other counseling recommendations.   Return in about 2 weeks (around  08/14/2021).   Future Appointments  Date Time Provider Department Center  08/19/2021 10:15 AM Allayne Stack, DO [facility]     Brand Males, CNM 07/31/21 10:15 AM

## 2021-08-01 LAB — RPR: RPR Ser Ql: NONREACTIVE

## 2021-08-01 LAB — CBC
Hematocrit: 35.8 % (ref 34.0–46.6)
Hemoglobin: 12.2 g/dL (ref 11.1–15.9)
MCH: 29.8 pg (ref 26.6–33.0)
MCHC: 34.1 g/dL (ref 31.5–35.7)
MCV: 87 fL (ref 79–97)
Platelets: 275 10*3/uL (ref 150–450)
RBC: 4.1 x10E6/uL (ref 3.77–5.28)
RDW: 11.7 % (ref 11.7–15.4)
WBC: 10.1 10*3/uL (ref 3.4–10.8)

## 2021-08-01 LAB — GLUCOSE TOLERANCE, 2 HOURS W/ 1HR
Glucose, 1 hour: 147 mg/dL (ref 70–179)
Glucose, 2 hour: 131 mg/dL (ref 70–152)
Glucose, Fasting: 73 mg/dL (ref 70–91)

## 2021-08-01 LAB — HIV ANTIBODY (ROUTINE TESTING W REFLEX): HIV Screen 4th Generation wRfx: NONREACTIVE

## 2021-08-19 ENCOUNTER — Encounter: Payer: Self-pay | Admitting: Family Medicine

## 2021-08-19 ENCOUNTER — Ambulatory Visit (INDEPENDENT_AMBULATORY_CARE_PROVIDER_SITE_OTHER): Payer: Medicaid Other | Admitting: Family Medicine

## 2021-08-19 ENCOUNTER — Other Ambulatory Visit: Payer: Self-pay

## 2021-08-19 VITALS — BP 98/61 | HR 83 | Wt 138.8 lb

## 2021-08-19 DIAGNOSIS — Z3493 Encounter for supervision of normal pregnancy, unspecified, third trimester: Secondary | ICD-10-CM

## 2021-08-19 DIAGNOSIS — Z3A30 30 weeks gestation of pregnancy: Secondary | ICD-10-CM

## 2021-08-19 NOTE — Progress Notes (Signed)
    Subjective:  Amber Moran is a 25 y.o. G2P1001 at [redacted]w[redacted]d being seen today for ongoing prenatal care.  She is currently monitored for the following issues for this low-risk pregnancy and has Supervision of low-risk pregnancy on their problem list.  Patient reports no complaints.  Contractions: Not present.  .  Movement: Present. Denies leaking of fluid.   The following portions of the patient's history were reviewed and updated as appropriate: allergies, current medications, past family history, past medical history, past social history, past surgical history and problem list.   Objective:   Vitals:   08/19/21 1015  BP: 98/61  Pulse: 83  Weight: 138 lb 12.8 oz (63 kg)    Fetal Status: Fetal Heart Rate (bpm): 150 Fundal Height: 30 cm Movement: Present     General:  Alert, oriented and cooperative. Patient is in no acute distress.  Skin: Skin is warm and dry. No rash noted.   Cardiovascular: Normal heart rate noted  Respiratory: Normal respiratory effort, no problems with respiration noted  Abdomen: Soft, gravid, appropriate for gestational age. Pain/Pressure: Absent     Pelvic:  Cervical exam deferred        Extremities: Normal range of motion.  Edema: None  Mental Status: Normal mood and affect. Normal behavior. Normal judgment and thought content.    Assessment and Plan:  Pregnancy: G2P1001 at [redacted]w[redacted]d  1. Encounter for supervision of low-risk pregnancy in third trimester Doing well, normal fetal movement. Discussed contraception options, she is still undecided.   2. [redacted] weeks gestation of pregnancy Third trimester labs reviewed and normal.   Preterm labor symptoms and general obstetric precautions including but not limited to vaginal bleeding, contractions, leaking of fluid and fetal movement were reviewed in detail with the patient. Please refer to After Visit Summary for other counseling recommendations.  Return in about 2 weeks (around 09/02/2021) for LROB.   Allayne Stack, DO

## 2021-08-19 NOTE — Progress Notes (Signed)
Patient denies any pain/pressure and stated baby is moving well. There were no questions or concerns.  Dawayne Patricia, CMA  08/19/21

## 2021-09-02 ENCOUNTER — Ambulatory Visit (INDEPENDENT_AMBULATORY_CARE_PROVIDER_SITE_OTHER): Payer: Medicaid Other | Admitting: Advanced Practice Midwife

## 2021-09-02 ENCOUNTER — Encounter: Payer: Self-pay | Admitting: Advanced Practice Midwife

## 2021-09-02 ENCOUNTER — Other Ambulatory Visit: Payer: Self-pay

## 2021-09-02 VITALS — BP 103/68 | HR 118 | Wt 139.4 lb

## 2021-09-02 DIAGNOSIS — Z3480 Encounter for supervision of other normal pregnancy, unspecified trimester: Secondary | ICD-10-CM

## 2021-09-02 DIAGNOSIS — Z3A32 32 weeks gestation of pregnancy: Secondary | ICD-10-CM

## 2021-09-02 NOTE — Progress Notes (Signed)
   PRENATAL VISIT NOTE  Subjective:  Amber Moran is a 25 y.o. G2P1001 at [redacted]w[redacted]d being seen today for ongoing prenatal care.  She is currently monitored for the following issues for this low-risk pregnancy and has Supervision of low-risk pregnancy on their problem list.  Patient reports no complaints.  Contractions: Not present. Vag. Bleeding: None.  Movement: Present. Denies leaking of fluid.   The following portions of the patient's history were reviewed and updated as appropriate: allergies, current medications, past family history, past medical history, past social history, past surgical history and problem list.   Objective:   Vitals:   09/02/21 1334  BP: 103/68  Pulse: (!) 118  Weight: 63.2 kg    Fetal Status: Fetal Heart Rate (bpm): 159 Fundal Height: 32 cm Movement: Present     General:  Alert, oriented and cooperative. Patient is in no acute distress.  Skin: Skin is warm and dry. No rash noted.   Cardiovascular: Normal heart rate noted  Respiratory: Normal respiratory effort, no problems with respiration noted  Abdomen: Soft, gravid, appropriate for gestational age.  Pain/Pressure: Absent     Pelvic: Cervical exam deferred        Extremities: Normal range of motion.  Edema: None  Mental Status: Normal mood and affect. Normal behavior. Normal judgment and thought content.   Assessment and Plan:  Pregnancy: G2P1001 at [redacted]w[redacted]d 1. Supervision of other normal pregnancy, antepartum - routine care  2. [redacted] weeks gestation of pregnancy   Preterm labor symptoms and general obstetric precautions including but not limited to vaginal bleeding, contractions, leaking of fluid and fetal movement were reviewed in detail with the patient. Please refer to After Visit Summary for other counseling recommendations.   Return in about 2 weeks (around 09/16/2021).  No future appointments.   Thressa Sheller DNP, CNM  09/02/21  1:52 PM

## 2021-09-16 ENCOUNTER — Ambulatory Visit (INDEPENDENT_AMBULATORY_CARE_PROVIDER_SITE_OTHER): Payer: Medicaid Other | Admitting: Student

## 2021-09-16 ENCOUNTER — Other Ambulatory Visit: Payer: Self-pay

## 2021-09-16 VITALS — BP 106/69 | HR 108 | Wt 139.4 lb

## 2021-09-16 DIAGNOSIS — Z3A34 34 weeks gestation of pregnancy: Secondary | ICD-10-CM

## 2021-09-16 DIAGNOSIS — Z3493 Encounter for supervision of normal pregnancy, unspecified, third trimester: Secondary | ICD-10-CM

## 2021-09-16 NOTE — Progress Notes (Signed)
° °  PRENATAL VISIT NOTE  Subjective:  Amber Moran is a 25 y.o. G2P1001 at [redacted]w[redacted]d being seen today for ongoing prenatal care.  She is currently monitored for the following issues for this low-risk pregnancy and has Supervision of low-risk pregnancy on their problem list.  Patient reports no complaints.  Contractions: Not present. Vag. Bleeding: None.  Movement: Present. Denies leaking of fluid.   The following portions of the patient's history were reviewed and updated as appropriate: allergies, current medications, past family history, past medical history, past social history, past surgical history and problem list.   Objective:   Vitals:   09/16/21 1322  BP: 106/69  Pulse: (!) 108  Weight: 139 lb 6.4 oz (63.2 kg)    Fetal Status: Fetal Heart Rate (bpm): 150 Fundal Height: 34 cm Movement: Present     General:  Alert, oriented and cooperative. Patient is in no acute distress.  Skin: Skin is warm and dry. No rash noted.   Cardiovascular: Normal heart rate noted  Respiratory: Normal respiratory effort, no problems with respiration noted  Abdomen: Soft, gravid, appropriate for gestational age.  Pain/Pressure: Absent     Pelvic: Cervical exam deferred        Extremities: Normal range of motion.  Edema: None  Mental Status: Normal mood and affect. Normal behavior. Normal judgment and thought content.   Assessment and Plan:  Pregnancy: G2P1001 at [redacted]w[redacted]d 1. Encounter for supervision of low-risk pregnancy in third trimester Patient would like to do birth control pill  Preterm labor symptoms and general obstetric precautions including but not limited to vaginal bleeding, contractions, leaking of fluid and fetal movement were reviewed in detail with the patient. Please refer to After Visit Summary for other counseling recommendations.   Return in about 2 weeks (around 09/30/2021), or LROB.  No future appointments.  Marylene Land, CNM

## 2021-09-28 NOTE — L&D Delivery Note (Signed)
°  Delivery Note Called to room and patient was complete and pushing. Head delivered 0344. Tight nuchal cord present. Shoulder and body delivered in usual fashion. At [place] a viable female was delivered via Vaginal, Spontaneous (Presentation: ROA).  Infant without cry, placed on mother's abdomen, dried and stimulated, and apneic. Cord clamped x 2 after 1-minute delay, and cut by Ubaldo Glassing, SNM. Code Blue Neonate activated. NICU at bedside due to thick meconium. Baby taken to warmer for evaluation. Cord gas drawn.Cord blood drawn. Placenta delivered spontaneously with gentle cord traction. Appears intact. Fundus firm with massage and Pitocin. Labia, perineum, vagina, and cervix inspected with 4x4.   APGAR: 4, 8; weight: pending see delivery summary    Cord: 3VC with the following complications:None.   Cord pH: 7.18  Anesthesia:  Epidural Episiotomy: None Lacerations: 2nd degree perineal  Suture Repair: 3.0 vicryl Est. Blood Loss (mL): 100  Mom to postpartum.  Baby boy Helyn App to Couplet care / Skin to Skin.  Jeananne Rama, SNM 4:22 AM

## 2021-10-06 ENCOUNTER — Ambulatory Visit (INDEPENDENT_AMBULATORY_CARE_PROVIDER_SITE_OTHER): Payer: Medicaid Other | Admitting: Obstetrics and Gynecology

## 2021-10-06 ENCOUNTER — Other Ambulatory Visit: Payer: Self-pay

## 2021-10-06 ENCOUNTER — Encounter: Payer: Self-pay | Admitting: Obstetrics and Gynecology

## 2021-10-06 ENCOUNTER — Other Ambulatory Visit (HOSPITAL_COMMUNITY)
Admission: RE | Admit: 2021-10-06 | Discharge: 2021-10-06 | Disposition: A | Discharge status: Home / Self Care | Payer: Medicaid Other | Source: Ambulatory Visit | Attending: Obstetrics and Gynecology | Admitting: Obstetrics and Gynecology

## 2021-10-06 DIAGNOSIS — Z3493 Encounter for supervision of normal pregnancy, unspecified, third trimester: Secondary | ICD-10-CM

## 2021-10-06 NOTE — Patient Instructions (Signed)

## 2021-10-06 NOTE — Progress Notes (Signed)
Subjective:  Amber Moran is a 26 y.o. G2P1001 at [redacted]w[redacted]d being seen today for ongoing prenatal care.  She is currently monitored for the following issues for this low-risk pregnancy and has Supervision of low-risk pregnancy on their problem list.  Patient reports general discomforts of pregnancy.  Contractions: Irritability. Vag. Bleeding: None.  Movement: Present. Denies leaking of fluid.   The following portions of the patient's history were reviewed and updated as appropriate: allergies, current medications, past family history, past medical history, past social history, past surgical history and problem list. Problem list updated.  Objective:   Vitals:   10/06/21 1439  BP: 114/74  Pulse: 97  Weight: 143 lb (64.9 kg)    Fetal Status: Fetal Heart Rate (bpm): 158   Movement: Present     General:  Alert, oriented and cooperative. Patient is in no acute distress.  Skin: Skin is warm and dry. No rash noted.   Cardiovascular: Normal heart rate noted  Respiratory: Normal respiratory effort, no problems with respiration noted  Abdomen: Soft, gravid, appropriate for gestational age. Pain/Pressure: Present     Pelvic:  Cervical exam performed        Extremities: Normal range of motion.  Edema: None  Mental Status: Normal mood and affect. Normal behavior. Normal judgment and thought content.   Urinalysis:      Assessment and Plan:  Pregnancy: G2P1001 at [redacted]w[redacted]d  1. Encounter for supervision of low-risk pregnancy in third trimester Labor precautions - GC/Chlamydia probe amp ([place])not at [facility] - Culture, beta strep (group b only)  Term labor symptoms and general obstetric precautions including but not limited to vaginal bleeding, contractions, leaking of fluid and fetal movement were reviewed in detail with the patient. Please refer to After Visit Summary for other counseling recommendations.  Return in about 1 week (around 10/13/2021) for OB visit, face to face, any provider.   Hermina Staggers, MD

## 2021-10-07 LAB — GC/CHLAMYDIA PROBE AMP (~~LOC~~) NOT AT ARMC
Chlamydia: NEGATIVE
Comment: NEGATIVE
Comment: NORMAL
Neisseria Gonorrhea: NEGATIVE

## 2021-10-07 LAB — OB RESULTS CONSOLE GC/CHLAMYDIA: Gonorrhea: NEGATIVE

## 2021-10-10 LAB — CULTURE, BETA STREP (GROUP B ONLY): Strep Gp B Culture: NEGATIVE

## 2021-10-15 ENCOUNTER — Ambulatory Visit (INDEPENDENT_AMBULATORY_CARE_PROVIDER_SITE_OTHER): Payer: Medicaid Other | Admitting: Nurse Practitioner

## 2021-10-15 ENCOUNTER — Encounter: Payer: Self-pay | Admitting: Nurse Practitioner

## 2021-10-15 ENCOUNTER — Other Ambulatory Visit: Payer: Self-pay

## 2021-10-15 VITALS — BP 101/76 | HR 90 | Wt 146.0 lb

## 2021-10-15 DIAGNOSIS — Z3493 Encounter for supervision of normal pregnancy, unspecified, third trimester: Secondary | ICD-10-CM

## 2021-10-15 DIAGNOSIS — Z3A38 38 weeks gestation of pregnancy: Secondary | ICD-10-CM

## 2021-10-15 NOTE — Patient Instructions (Signed)
BRAINSTORMING  Develop a Plan Goals: Provide a way to start conversation about your new life with a baby Assist parents in recognizing and using resources within their reach Help pave the way before birth for an easier period of transition afterwards.  Make a list of the following information to keep in a central location: Full name of Mom and Partner: _____________________________________________ 39 full name and Date of Birth: ___________________________________________ Home Address: ___________________________________________________________ ________________________________________________________________________ Home Phone: ____________________________________________________________ Parents' cell numbers: _____________________________________________________ ________________________________________________________________________ Name and contact info for OB: ______________________________________________ Name and contact info for Pediatrician:________________________________________ Contact info for Lactation Consultants: ________________________________________  REST and SLEEP *You each need at least 4-5 hours of uninterrupted sleep every day. Write specific names and contact information.* How are you going to rest in the postpartum period? While partner's home? When partner returns to work? When you both return to work? Where will your baby sleep? Who is available to help during the day? Evening? Night? Who could move in for a period to help support you? What are some ideas to help you get enough sleep? __________________________________________________________________________________________________________________________________________________________________________________________________________________________________________ NUTRITIOUS FOOD AND DRINK *Plan for meals before your baby is born so you can have healthy food to eat during the immediate postpartum  period.* Who will look after breakfast? Lunch? Dinner? List names and contact information. Brainstorm quick, healthy ideas for each meal. What can you do before baby is born to prepare meals for the postpartum period? How can others help you with meals? Which grocery stores provide online shopping and delivery? Which restaurants offer take-out or delivery options? ______________________________________________________________________________________________________________________________________________________________________________________________________________________________________________________________________________________________________________________________________________________________________________________________________  CARE FOR MOM *It's important that mom is cared for and pampered in the postpartum period. Remember, the most important ways new mothers need care are: sleep, nutrition, gentle exercise, and time off.* Who can come take care of mom during this period? Make a list of people with their contact information. List some activities that make you feel cared for, rested, and energized? Who can make sure you have opportunities to do these things? Does mom have a space of her very own within your home that's just for her? Make a [facility] where she can be comfortable, rest, and renew herself daily. ______________________________________________________________________________________________________________________________________________________________________________________________________________________________________________________________________________________________________________________________________________________________________________________________________    CARE FOR AND FEEDING BABY *Knowledgeable and encouraging people will offer the best support with regard to feeding your baby.* Educate yourself and choose the best feeding option  for your baby. Make a list of people who will guide, support, and be a resource for you as your care for and feed your baby. (Friends that have breastfed or are currently breastfeeding, lactation consultants, breastfeeding support groups, etc.) Consider a postpartum doula. (These websites can give you information: dona.org & BuyingShow.es) Seek out local breastfeeding resources like the breastfeeding support group at [employer] or [employer]. ______________________________________________________________________________________________________________________________________________________________________________________________________________________________________________________________________________________________________________________________________________________________________________________________________  Verner Chol AND ERRANDS Who can help with a thorough cleaning before baby is born? Make a list of people who will help with housekeeping and chores, like laundry, light cleaning, dishes, bathrooms, etc. Who can run some errands for you? What can you do to make sure you are stocked with basic supplies before baby is born? Who is going to do the shopping? ______________________________________________________________________________________________________________________________________________________________________________________________________________________________________________________________________________________________________________________________________________________________________________________________________     Family Adjustment *Nurture yourselvesit helps parents be more loving and allows for better bonding with their child.* What sorts of things do you and partner enjoy doing together? Which activities help you to connect and strengthen your relationship? Make a list of those things. Make a list of people whom  trust to care for your baby so you  can have some time together as a couple. °What types of things help partner feel connected to Mom? Make a list. °What needs will partner have in order to bond with baby? °Other children? Who will care for them when you go into labor and while you are in the hospital? °Think about what the needs of your older children might be. Who can help you meet those needs? In what ways are you helping them prepare for bringing baby home? List some specific strategies you have for family adjustment. °_______________________________________________________________________________________________________________________________________________________________________________________________________________________________________________________________________________________________________________________________________________ ° °SUPPORT °*Someone who can empathize with experiences normalizes your problems and makes them more bearable.* °Make a list of other friends, neighbors, and/or co-workers you know with infants (and small children, if applicable) with whom you can connect. °Make a list of local or online support groups, mom groups, etc. in which you can be involved. °______________________________________________________________________________________________________________________________________________________________________________________________________________________________________________________________________________________________________________________________________________________________________________________________________ ° °Childcare Plans °Investigate and plan for childcare if mom is returning to work. °Talk about mom's concerns about her transition back to work. °Talk about partner's concerns regarding this transition. ° °Mental Health °*Your mental health is one of the highest priorities for a pregnant or postpartum mom.* °1 in 5 women experience anxiety and/or depression from the time  of conception through the first year after birth. °Postpartum Mood Disorders are the #1 complication of pregnancy and childbirth and the suffering experienced by these mothers is not necessary! These illnesses are temporary and respond well to treatment, which often includes self-care, social support, talk therapy, and medication when needed. °Women experiencing anxiety and depression often say things like: “I'm supposed to be happy…why do I feel so sad?”, “Why can't I snap out of it?”, “I'm having thoughts that scare me.” °There is no need to be embarrassed if you are feeling these symptoms: °Overwhelmed, anxious, angry, sad, guilty, irritable, hopeless, exhausted but can't sleep °You are NOT alone. You are NOT to blame. With help, you WILL be well. °Where can I find help? Medical professionals such as your OB, midwife, gynecologist, family practitioner, primary care provider, pediatrician, or mental health providers; [facility] support groups: Feelings After Birth, Breastfeeding Support Group, Baby and Me Group, and Fit 4 Two exercise classes. °You have permission to ask for help. It will confirm your feelings, validate your experiences, share/learn coping strategies, and gain support and encouragement as you heal. You are important! °BRAINSTORM °Make a list of local resources, including resources for mom and for partner. °Identify support groups. °Identify people to call late at night - include names and contact info. °Talk with partner about perinatal mood and anxiety disorders. °Talk with your OB, midwife, and doula about baby blues and about perinatal mood and anxiety disorders. °Talk with your pediatrician about perinatal mood and anxiety disorders. ° ° °Support & Sanity Savers   °What do you really need? ° °Basics °In preparing for a new baby, many expectant parents spend hours shopping for baby clothes, decorating the nursery, and deciding which car seat to buy. Yet most don't think much about what  the reality of parenting a newborn will be like, and what they need to make it through that. So, here is the advice of experienced parents. We know you'll read this, and think “they're exaggerating, I don't really need that.” Just trust us on these, OK? Plan for all of this, and if it turns out you don't need it, come back and teach us how you did it! ° °Must-Haves (Once baby's survival   needs are met, make sure you attend to your own survival needs!) °Sleep °An average newborn sleeps 16-18 hours per day, over 6-7 sleep periods, rarely more than three hours at a time. It is normal and healthy for a newborn to wake throughout the night... but really hard on parents!! °Naps. Prioritize sleep above any responsibilities like: cleaning house, visiting friends, running errands, etc.  Sleep whenever baby sleeps. If you can't nap, at least have restful times when baby eats. The more rest you get, the more patient you will be, the more emotionally stable, and better at solving problems. ° °Food °You may not have realized it would be difficult to eat when you have a newborn. Yet, when we talk to °countless new parents, they say things like “it may be 2:00 pm when I realize I haven't had breakfast yet.” Or “every time we sit down to dinner, baby needs to eat, and my food gets cold, so I don't bother to eat it.” °Finger food. Before your baby is born, stock up with one months' worth of food that: 1) you can eat with one hand while holding a baby, 2) doesn't need to be prepped, 3) is good hot or cold, 4) doesn't spoil when left out for a few hours, and 5) you like to eat. Think about: nuts, dried fruit, Clif bars, pretzels, jerky, gogurt, baby carrots, apples, bananas, crackers, cheez-n-crackers, string cheese, hot pockets or frozen burritos to microwave, garden burgers and breakfast pastries to put in the toaster, yogurt drinks, etc. °Restaurant Menus. Make lists of your favorite restaurants & menu items. When family/friends  want to help, you can give specific information without much thought. They can either bring you the food or send gift cards for just the right meals. °Freezer Meals.  Take some time to make a few meals to put in the freezer ahead of time.  Easy to freeze meals can be anything such as soup, lasagna, chicken pie, or spaghetti sauce. °Set up a Meal Schedule.  Ask friends and family to sign up to bring you meals during the first few weeks of being home. (It can be passed around at baby showers!) You have no idea how helpful this will be until you are in the throes of parenting.  www.takethemameal.com is a great website to check out. °Emotional Support °Know who to call when you're stressed out. Parenting a newborn is very challenging work. There are times when it totally overwhelms your normal coping abilities. EVERY NEW PARENT NEEDS TO HAVE A PLAN FOR WHO TO CALL WHEN THEY JUST CAN'T COPE ANY MORE. (And it has to be someone other than the baby's other parent!) Before your baby is born, come up with at least one person you can call for support - write their phone number down and post it on the refrigerator. °Anxiety & Sadness. Baby blues are normal after pregnancy; however, there are more severe types of anxiety & sadness which can occur and should not be ignored.  They are always treatable, but you have to take the first step by reaching out for help. [facility] offers a “Mom Talk” group which meets every Tuesday from 10 am - 11 am.  This group is for new moms who need support and connection after their babies are born.  Call [phone].  °Really, Really Helpful (Plan for them! Make sure these happen often!!) °Physical Support with Taking Care of Yourselves °Asking friends and family. Before your baby is born, set up a schedule   schedule of people who can come and visit and help out (or ask a friend to schedule for you). Any time someone says let me know what I can do to help, sign them up for a day. When they get  there, their job is not to take care of the baby (that's your job and your joy). Their job is to take care of you!  Postpartum doulas. If you don't have anyone you can call on for support, look into postpartum doulas:  professionals at helping parents with caring for baby, caring for themselves, getting breastfeeding started, and helping with household tasks. www.padanc.org is a helpful website for learning about doulas in our area. Peer Support / Parent Groups Why: One of the greatest ideas for new parents is to be around other new parents. Parent groups give you a chance to share and listen to others who are going through the same season of life, get a sense of what is normal infant development by watching several babies learn and grow, share your stories of triumph and struggles with empathetic ears, and forgive your own mistakes when you realize all parents are learning by trial and error. Where to find: There are many places you can meet other new parents throughout our community.  [facility] offers the following classes for new moms and their little ones:  Baby and Me (Birth to [place]) and Breastfeeding Support Group. Go to www.conehealthybaby.com or call [phone] for more information. Time for your Relationship It's easy to get so caught up in meeting baby's immediate needs that it's hard to find time to connect with your partner, and meet the needs of your relationship. It's also easy to forget what quality time with your partner actually looks like. If you take your baby on a date, you'd be amazed how much of your couple time is spent feeding the baby, diapering the baby, admiring the baby, and talking about the baby. Dating: Try to take time for just the two of you. Babysitter tip: Sometimes when moms are breastfeeding a newborn, they find it hard to figure out how to schedule outings around baby's unpredictable feeding schedules. Have the babysitter come for a three hour period. When  she comes over, if baby has just eaten, you can leave right away, and come back in two hours. If baby hasn't fed recently, you start the date at home. Once baby gets hungry and gets a good feeding in, you can head out for the rest of your date time. Date Nights at Home: If you can't get out, at least set aside one evening a week to prioritize your relationship: whenever baby dozes off or doesn't have any immediate needs, spend a little time focusing on each other. Potential conflicts: The main relationship conflicts that come up for new parents are: issues related to sexuality, financial stresses, a feeling of an unfair division of household tasks, and conflicts in parenting styles. The more you can work on these issues before baby arrives, the better!  Fun and Frills (Don't forget these and don't feel guilty for indulging in them!) Everyone has something in life that is a fun little treat that they do just for themselves. It may be: reading the morning paper, or going for a daily jog, or having coffee with a friend once a week, or going to a movie on Friday nights, or fine chocolates, or bubble baths, or curling up with a good book. Unless you do fun things for yourself every now and  it's hard to have the energy for fun with your baby. Whatever your “special” treats are, make sure you find a way to continue to indulge in them after your baby is born. These special moments can recharge you, and allow you to return to baby with a new joy ° ° °PERINATAL MOOD DISORDERS: MATERNAL MENTAL HEALTH FROM CONCEPTION THROUGH THE POSTPARTUM PERIOD ° ° °_________________________________________Emergency and Crisis Resources °If you are an imminent risk to self or others, are experiencing intense personal distress, and/or have noticed significant changes in activities of daily living, call:  °911 °[facility]: 336-890-2700 ° [address], [place], , [address] °Mobile Crisis:  877-626-1772 °National Suicide Hotline: 988 °Or visit the following crisis centers: °Local Emergency Departments °[facility]: [address], [place]  °336-676-6840. Hours: 8:30AM-5PM. Insurance Accepted: Medicaid, Medicare, and Uninsured.  °[facility]:  [address], [place]  °Mon-Friday 8am-3pm, [phone]   ° °                                                                              ___________ Non-Crisis Resources °To identify specific providers that are covered by your insurance, contact your insurance company or local agencies:  °[facility]: 1-800-256-2452 °[facility]: 888-581-9988 °[facility]: 1-800-939-5911 °Postpartum Support International- Warm-line: 1-800-944-4773  ° °                                                   __Outpatient Therapy and Medication Management  ° °Providers:  °[facility]: 336-292-1510 °Hours: 9AM-5PM  Insurance Accepted: AARP, Aetna, BCBS, Cigna, [employer], Humana, Medicare  °[facility] [phone] °Hours: 8AM-5:30PM  nsurance Accepted: All insurances EXCEPT AARP, Aetna, [employer], and Humana °[facility]: [phone] °Hours: 8AM-8PM Insurance Accepted: Aetna, BCBS, Cigna, [employer], Medicaid, Medicare, Uninsured °[facility]: [phone]- [phone] °[facility]: [phone] °Hours: 8:30AM-7PM Insurance Accepted: Aetna, BCBS, Medicaid, Medicare, Tricare, United Healthcare °[facility]:  [phone]  ° Hours:9AM-5PM Insurance Accepted:  Aetna, BCBS, [facility], Medicaid, United Health Care  °[facility]: [phone] °Hours: 9AM-5:30PM Insurance Accepted: AARP, Aetna, BCBS, Cigna, and Medicaid, Medicare, United Health Care °[facility]:  [phone] °Hours: 9am-5pm Insurance Accepted: BCBS; they do not accept Medicaid/Medicare °[facility]: [phone] °Hours: 9am-9pm Insurance Accepted: All major insurance including Medicaid and Medicare °[facility]: [phone] °Hours: 9AM- 5PM Insurance Accepted: All insurances EXCEPT Medicaid and Medicare. °[facility]: [phone] ° ° °____________                                                                     Parenting Support Groups °[facility]: 336-832-6682 °[facility]:  336- [phone] °Family Support Network: (support for children in the NICU   and/or with special needs), 336-832-6507 ° ° °___________                                                                 Mental Health Support Groups °[employer]: 336-373-1402 °  ° °_____________                                                                                  Online Resources °Postpartum Support International: http://www.postpartum.net/  800-944-4PPD °2Moms Supporting Moms:  www.momssupportingmoms.net ° °  °

## 2021-10-15 NOTE — Progress Notes (Signed)
Patient reports "on and off" contractions that are more than 5 minutes apart. Patient denies any vaginal bleeding/discharge

## 2021-10-15 NOTE — Progress Notes (Signed)
° ° °  Subjective:  Amber Moran is a 26 y.o. G2P1001 at [redacted]w[redacted]d being seen today for ongoing prenatal care.  She is currently monitored for the following issues for this low-risk pregnancy and has Supervision of low-risk pregnancy on their problem list.  Patient reports no complaints.  Contractions: Irritability. Vag. Bleeding: None.  Movement: Present. Denies leaking of fluid.   The following portions of the patient's history were reviewed and updated as appropriate: allergies, current medications, past family history, past medical history, past social history, past surgical history and problem list. Problem list updated.  Objective:   Vitals:   10/15/21 0820  BP: 101/76  Pulse: 90  Weight: 146 lb (66.2 kg)    Fetal Status: Fetal Heart Rate (bpm): 165 Fundal Height: 37 cm Movement: Present  Presentation: Vertex  General:  Alert, oriented and cooperative. Patient is in no acute distress.  Skin: Skin is warm and dry. No rash noted.   Cardiovascular: Normal heart rate noted  Respiratory: Normal respiratory effort, no problems with respiration noted  Abdomen: Soft, gravid, appropriate for gestational age. Pain/Pressure: Present     Pelvic:  Cervical exam performed Dilation: Fingertip Effacement (%): Thick Station: -2  Extremities: Normal range of motion.  Edema: None  Mental Status: Normal mood and affect. Normal behavior. Normal judgment and thought content.   Urinalysis:      Assessment and Plan:  Pregnancy: G2P1001 at [redacted]w[redacted]d  1. Encounter for supervision of low-risk pregnancy in third trimester Doing well, baby moving well Having some contractions - no cervical change   2. [redacted] weeks gestation of pregnancy   Term labor symptoms and general obstetric precautions including but not limited to vaginal bleeding, contractions, leaking of fluid and fetal movement were reviewed in detail with the patient. Please refer to After Visit Summary for other counseling recommendations.  Return in  about 1 week (around 10/22/2021) for in person ROB.  Nolene Bernheim, RN, MSN, NP-BC Nurse Practitioner, [facility] for [employer], [facility] 10/15/2021 8:36 AM

## 2021-10-23 ENCOUNTER — Ambulatory Visit (INDEPENDENT_AMBULATORY_CARE_PROVIDER_SITE_OTHER): Payer: Medicaid Other | Admitting: Student

## 2021-10-23 ENCOUNTER — Other Ambulatory Visit: Payer: Self-pay

## 2021-10-23 ENCOUNTER — Other Ambulatory Visit: Payer: Self-pay | Admitting: Student

## 2021-10-23 VITALS — BP 102/73 | HR 87 | Wt 144.0 lb

## 2021-10-23 DIAGNOSIS — Z3A39 39 weeks gestation of pregnancy: Secondary | ICD-10-CM

## 2021-10-23 DIAGNOSIS — Z3493 Encounter for supervision of normal pregnancy, unspecified, third trimester: Secondary | ICD-10-CM

## 2021-10-23 NOTE — Patient Instructions (Signed)
New Induction of Labor Process for [employer] [facility]  In Fall 2020 [facility] changed it's process for scheduling inductions of labor to create more induction slots and to make sure patients get COVID-19 testing in advance. After you have been tested you need to quarantine so that you do not get infected after your test. You should not go anywhere after your test except necessary medical appointments.    You have been scheduled for induction of labor on 11-04-21. Although you may have a specific time listed on your After Visit Summary or MyChart, we cannot predict when your room will be available. Please disregard this time. A Labor and Delivery staff member will call you on the day that you are scheduled when your room is available. You will need to arrive within one hour of being called. If you do not arrive within this time frame, the next person on the list will be called in and you will move down the list. You may eat a light meal before coming to the hospital. If you go into labor, think your water has broken, experience bright red bleeding or don't feel your baby moving as much as usual before your induction, please call your Ob/Gyn's office or come to Entrance C, Maternity Assessment Unit for evaluation.  Thank you,  Center for [employer]

## 2021-10-23 NOTE — Progress Notes (Signed)
° °  PRENATAL VISIT NOTE  Subjective:  Amber Moran is a 26 y.o. G2P1001 at [redacted]w[redacted]d being seen today for ongoing prenatal care.  She is currently monitored for the following issues for this low-risk pregnancy and has Supervision of low-risk pregnancy on their problem list.  Patient reports no complaints.  Contractions: Not present. Vag. Bleeding: None.  Movement: Present. Denies leaking of fluid.   The following portions of the patient's history were reviewed and updated as appropriate: allergies, current medications, past family history, past medical history, past social history, past surgical history and problem list.   Objective:   Vitals:   10/23/21 1624  BP: 102/73  Pulse: 87  Weight: 144 lb (65.3 kg)    Fetal Status: Fetal Heart Rate (bpm): 142 Fundal Height: 39 cm Movement: Present     General:  Alert, oriented and cooperative. Patient is in no acute distress.  Skin: Skin is warm and dry. No rash noted.   Cardiovascular: Normal heart rate noted  Respiratory: Normal respiratory effort, no problems with respiration noted  Abdomen: Soft, gravid, appropriate for gestational age.  Pain/Pressure: Absent     Pelvic: Cervical exam deferred        Extremities: Normal range of motion.     Mental Status: Normal mood and affect. Normal behavior. Normal judgment and thought content.   Assessment and Plan:  Pregnancy: G2P1001 at [redacted]w[redacted]d 1. Encounter for supervision of low-risk pregnancy in third trimester   2. [redacted] weeks gestation of pregnancy   -CMA to call for NST/BPP at MFM next week; [facility] is completely full through Feb 7 -IOL orders placed -Patient has no questions about IOL, she has been induced before -patient informed about new IOL procedure and COVID testing  -confirmed birth control method  Term labor symptoms and general obstetric precautions including but not limited to vaginal bleeding, contractions, leaking of fluid and fetal movement were reviewed in detail with the  patient. Please refer to After Visit Summary for other counseling recommendations.   Return in about 6 days (around 10/29/2021).  Future Appointments  Date Time Provider Department Center  11/04/2021  7:45 AM [facility] ROOM [facility] None    Charlesetta Garibaldi [place], [place]

## 2021-10-24 ENCOUNTER — Telehealth (HOSPITAL_COMMUNITY): Payer: Self-pay | Admitting: *Deleted

## 2021-10-24 ENCOUNTER — Encounter (HOSPITAL_COMMUNITY): Payer: Self-pay | Admitting: *Deleted

## 2021-10-24 NOTE — Telephone Encounter (Signed)
Preadmission screen  

## 2021-10-27 ENCOUNTER — Encounter: Payer: Self-pay | Admitting: *Deleted

## 2021-10-28 ENCOUNTER — Encounter (HOSPITAL_COMMUNITY): Payer: Self-pay | Admitting: Obstetrics and Gynecology

## 2021-10-28 ENCOUNTER — Inpatient Hospital Stay (EMERGENCY_DEPARTMENT_HOSPITAL)
Admission: AD | Admit: 2021-10-28 | Discharge: 2021-10-28 | Disposition: A | Discharge status: Home / Self Care | Payer: Medicaid Other | Source: Home / Self Care | Attending: Obstetrics and Gynecology | Admitting: Obstetrics and Gynecology

## 2021-10-28 ENCOUNTER — Other Ambulatory Visit: Payer: Self-pay

## 2021-10-28 DIAGNOSIS — O471 False labor at or after 37 completed weeks of gestation: Secondary | ICD-10-CM | POA: Insufficient documentation

## 2021-10-28 DIAGNOSIS — Z3A40 40 weeks gestation of pregnancy: Secondary | ICD-10-CM | POA: Insufficient documentation

## 2021-10-28 DIAGNOSIS — O480 Post-term pregnancy: Secondary | ICD-10-CM | POA: Insufficient documentation

## 2021-10-28 NOTE — MAU Note (Signed)
...  Amber Moran is a 26 y.o. at [redacted]w[redacted]d here in MAU reporting: CTX since 0600 this morning that are now seven minutes apart. She is also endorsing mucous like vaginal bleeding that started around 0900 this morning. No LOF. +FM.   GBS-  Pain score: 5/10 lower abdomen   FHT: 145 initial external Lab orders placed from triage:  MAU Labor Eval

## 2021-10-28 NOTE — MAU Provider Note (Signed)
Ms. Amber Moran is a G2P1001 at [redacted]w[redacted]d seen in MAU for labor. RN labor check, not seen by provider. SVE by RN Dilation: 1.5 Effacement (%): 50 Cervical Position: Posterior Station: -3 Presentation: Vertex Exam by:: Amber Crocker, RN  Cervix unchanged on reassessment  NST - FHR: 140 bpm / moderate variability / accels present / decels absent / TOCO: regular every 4 mins   Plan:  D/C home with labor precautions Return to MAU: If you have heavier bleeding that soaks through more that 2 pads per hour for an hour or more If you bleed so much that you feel like you might pass out or you do pass out If you have significant abdominal pain that is not improved with Tylenol 1000 mg every 8 hours as needed for pain If you develop a fever > 100.5  Keep scheduled appt with L&C for IOL on 11/04/2021  Amber Moran, CNM  10/28/2021 12:28 PM

## 2021-10-28 NOTE — Discharge Instructions (Signed)
Return to MAU: If you have heavier bleeding that soaks through more that 2 pads per hour for an hour or more If you bleed so much that you feel like you might pass out or you do pass out If you have significant abdominal pain that is not improved with Tylenol 1000 mg every 8 hours as needed for pain If you develop a fever > 100.5  

## 2021-10-29 ENCOUNTER — Inpatient Hospital Stay (HOSPITAL_COMMUNITY): Payer: Medicaid Other | Admitting: Anesthesiology

## 2021-10-29 ENCOUNTER — Encounter (HOSPITAL_COMMUNITY): Payer: Self-pay | Admitting: Student

## 2021-10-29 ENCOUNTER — Inpatient Hospital Stay (HOSPITAL_COMMUNITY)
Admission: AD | Admit: 2021-10-29 | Discharge: 2021-11-01 | DRG: 805 | Disposition: A | Discharge status: Home / Self Care | Payer: Medicaid Other | Attending: Family Medicine | Admitting: Family Medicine

## 2021-10-29 ENCOUNTER — Other Ambulatory Visit: Payer: Self-pay

## 2021-10-29 DIAGNOSIS — O411230 Chorioamnionitis, third trimester, not applicable or unspecified: Secondary | ICD-10-CM | POA: Diagnosis present

## 2021-10-29 DIAGNOSIS — Z3A40 40 weeks gestation of pregnancy: Secondary | ICD-10-CM

## 2021-10-29 DIAGNOSIS — O411290 Chorioamnionitis, unspecified trimester, not applicable or unspecified: Secondary | ICD-10-CM | POA: Diagnosis not present

## 2021-10-29 DIAGNOSIS — Z23 Encounter for immunization: Secondary | ICD-10-CM | POA: Diagnosis not present

## 2021-10-29 DIAGNOSIS — O26893 Other specified pregnancy related conditions, third trimester: Secondary | ICD-10-CM | POA: Diagnosis present

## 2021-10-29 DIAGNOSIS — Z20822 Contact with and (suspected) exposure to covid-19: Secondary | ICD-10-CM | POA: Diagnosis present

## 2021-10-29 DIAGNOSIS — O480 Post-term pregnancy: Principal | ICD-10-CM | POA: Diagnosis present

## 2021-10-29 DIAGNOSIS — Z3490 Encounter for supervision of normal pregnancy, unspecified, unspecified trimester: Secondary | ICD-10-CM

## 2021-10-29 LAB — RESP PANEL BY RT-PCR (FLU A&B, COVID) ARPGX2
Influenza A by PCR: NEGATIVE
Influenza B by PCR: NEGATIVE
SARS Coronavirus 2 by RT PCR: NEGATIVE

## 2021-10-29 LAB — CBC
HCT: 39.5 % (ref 36.0–46.0)
Hemoglobin: 13 g/dL (ref 12.0–15.0)
MCH: 27.8 pg (ref 26.0–34.0)
MCHC: 32.9 g/dL (ref 30.0–36.0)
MCV: 84.4 fL (ref 80.0–100.0)
Platelets: 257 10*3/uL (ref 150–400)
RBC: 4.68 MIL/uL (ref 3.87–5.11)
RDW: 13.9 % (ref 11.5–15.5)
WBC: 11.7 10*3/uL — ABNORMAL HIGH (ref 4.0–10.5)
nRBC: 0 % (ref 0.0–0.2)

## 2021-10-29 LAB — RPR: RPR Ser Ql: NONREACTIVE

## 2021-10-29 LAB — TYPE AND SCREEN
ABO/RH(D): AB POS
Antibody Screen: NEGATIVE

## 2021-10-29 MED ORDER — ACETAMINOPHEN 325 MG PO TABS
650.0000 mg | ORAL_TABLET | ORAL | Status: DC | PRN
  Administered 2021-10-29: 650 mg via ORAL
  Filled 2021-10-29: qty 2

## 2021-10-29 MED ORDER — OXYTOCIN-SODIUM CHLORIDE 30-0.9 UT/500ML-% IV SOLN
2.5000 [IU]/h | INTRAVENOUS | Status: DC
  Administered 2021-10-30: 2.5 [IU]/h via INTRAVENOUS
  Filled 2021-10-29: qty 500

## 2021-10-29 MED ORDER — MISOPROSTOL 25 MCG QUARTER TABLET: 25.0000 ug | ORAL_TABLET | ORAL | Status: DC | PRN

## 2021-10-29 MED ORDER — EPHEDRINE 5 MG/ML INJ: 10.0000 mg | INTRAVENOUS | Status: DC | PRN

## 2021-10-29 MED ORDER — PHENYLEPHRINE 40 MCG/ML (10ML) SYRINGE FOR IV PUSH (FOR BLOOD PRESSURE SUPPORT): 80.0000 ug | PREFILLED_SYRINGE | INTRAVENOUS | Status: DC | PRN

## 2021-10-29 MED ORDER — LIDOCAINE HCL (PF) 1 % IJ SOLN: 30.0000 mL | INTRAMUSCULAR | Status: DC | PRN

## 2021-10-29 MED ORDER — LACTATED RINGERS IV SOLN: 500.0000 mL | INTRAVENOUS | Status: DC | PRN

## 2021-10-29 MED ORDER — OXYTOCIN BOLUS FROM INFUSION
333.0000 mL | Freq: Once | INTRAVENOUS | Status: AC
  Administered 2021-10-30: 333 mL via INTRAVENOUS

## 2021-10-29 MED ORDER — FENTANYL-BUPIVACAINE-NACL 0.5-0.125-0.9 MG/250ML-% EP SOLN
12.0000 mL/h | EPIDURAL | Status: DC | PRN
  Administered 2021-10-29: 12 mL/h via EPIDURAL
  Filled 2021-10-29: qty 250

## 2021-10-29 MED ORDER — LACTATED RINGERS IV SOLN
INTRAVENOUS | Status: DC
  Administered 2021-10-29 (×2): 950 mL via INTRAVENOUS

## 2021-10-29 MED ORDER — FENTANYL CITRATE (PF) 100 MCG/2ML IJ SOLN
100.0000 ug | INTRAMUSCULAR | Status: DC | PRN
  Administered 2021-10-29 (×3): 100 ug via INTRAVENOUS
  Filled 2021-10-29 (×2): qty 2

## 2021-10-29 MED ORDER — LACTATED RINGERS IV SOLN: 500.0000 mL | Freq: Once | INTRAVENOUS | Status: DC

## 2021-10-29 MED ORDER — OXYCODONE-ACETAMINOPHEN 5-325 MG PO TABS: 1.0000 | ORAL_TABLET | ORAL | Status: DC | PRN

## 2021-10-29 MED ORDER — SOD CITRATE-CITRIC ACID 500-334 MG/5ML PO SOLN: 30.0000 mL | ORAL | Status: DC | PRN

## 2021-10-29 MED ORDER — FENTANYL-BUPIVACAINE-NACL 0.5-0.125-0.9 MG/250ML-% EP SOLN: 12.0000 mL/h | EPIDURAL | Status: DC | PRN

## 2021-10-29 MED ORDER — TERBUTALINE SULFATE 1 MG/ML IJ SOLN: 0.2500 mg | Freq: Once | INTRAMUSCULAR | Status: DC | PRN

## 2021-10-29 MED ORDER — PHENYLEPHRINE 40 MCG/ML (10ML) SYRINGE FOR IV PUSH (FOR BLOOD PRESSURE SUPPORT)
80.0000 ug | PREFILLED_SYRINGE | INTRAVENOUS | Status: DC | PRN
  Filled 2021-10-29: qty 10

## 2021-10-29 MED ORDER — SODIUM CHLORIDE 0.9 % IV SOLN
2.0000 g | Freq: Once | INTRAVENOUS | Status: AC
  Administered 2021-10-30: 2 g via INTRAVENOUS
  Filled 2021-10-29: qty 2000

## 2021-10-29 MED ORDER — ONDANSETRON HCL 4 MG/2ML IJ SOLN: 4.0000 mg | Freq: Four times a day (QID) | INTRAMUSCULAR | Status: DC | PRN

## 2021-10-29 MED ORDER — GENTAMICIN SULFATE 40 MG/ML IJ SOLN
5.0000 mg/kg | Freq: Once | INTRAVENOUS | Status: AC
  Administered 2021-10-30: 340 mg via INTRAVENOUS
  Filled 2021-10-29: qty 8.5

## 2021-10-29 MED ORDER — LIDOCAINE-EPINEPHRINE (PF) 2 %-1:200000 IJ SOLN
INTRAMUSCULAR | Status: DC | PRN
  Administered 2021-10-29: 5 mL via EPIDURAL

## 2021-10-29 MED ORDER — OXYTOCIN-SODIUM CHLORIDE 30-0.9 UT/500ML-% IV SOLN
1.0000 m[IU]/min | INTRAVENOUS | Status: DC
  Administered 2021-10-29: 2 m[IU]/min via INTRAVENOUS

## 2021-10-29 MED ORDER — DIPHENHYDRAMINE HCL 50 MG/ML IJ SOLN: 12.5000 mg | INTRAMUSCULAR | Status: DC | PRN

## 2021-10-29 MED ORDER — FENTANYL CITRATE (PF) 100 MCG/2ML IJ SOLN
INTRAMUSCULAR | Status: AC
  Filled 2021-10-29: qty 2

## 2021-10-29 MED ORDER — OXYCODONE-ACETAMINOPHEN 5-325 MG PO TABS: 2.0000 | ORAL_TABLET | ORAL | Status: DC | PRN

## 2021-10-29 MED ORDER — LACTATED RINGERS IV SOLN
500.0000 mL | Freq: Once | INTRAVENOUS | Status: AC
  Administered 2021-10-29: 500 mL via INTRAVENOUS

## 2021-10-29 NOTE — Anesthesia Preprocedure Evaluation (Signed)
Anesthesia Evaluation  Patient identified by MRN, date of birth, ID band Patient awake    Reviewed: Allergy & Precautions, Patient's Chart, lab work & pertinent test results  Airway Mallampati: II       Dental no notable dental hx.    Pulmonary    Pulmonary exam normal        Cardiovascular negative cardio ROS Normal cardiovascular exam     Neuro/Psych    GI/Hepatic   Endo/Other    Renal/GU      Musculoskeletal   Abdominal   Peds  Hematology   Anesthesia Other Findings   Reproductive/Obstetrics (+) Pregnancy                             Anesthesia Physical Anesthesia Plan  ASA: 2  Anesthesia Plan: Epidural   Post-op Pain Management:    Induction:   PONV Risk Score and Plan: 0  Airway Management Planned: Natural Airway  Additional Equipment: None  Intra-op Plan:   Post-operative Plan:   Informed Consent: I have reviewed the patients History and Physical, chart, labs and discussed the procedure including the risks, benefits and alternatives for the proposed anesthesia with the patient or authorized representative who has indicated his/her understanding and acceptance.       Plan Discussed with:   Anesthesia Plan Comments: (Lab Results      Component                Value               Date                      WBC                      11.7 (H)            10/29/2021                HGB                      13.0                10/29/2021                HCT                      39.5                10/29/2021                MCV                      84.4                10/29/2021                PLT                      257                 10/29/2021           )        Anesthesia Quick Evaluation

## 2021-10-29 NOTE — H&P (Addendum)
OBSTETRIC ADMISSION HISTORY AND PHYSICAL  Amber Moran is a 26 y.o. female G2P1001 with IUP at [redacted]w[redacted]d by LMP presenting for SOL. She reports +FMs, No LOF, no VB.  She plans on breast and bottle feeding. She request POPs for birth control. She received her prenatal care at  [place]: By LMP --->  Estimated Date of Delivery: 10/28/21  Sono:    @[redacted]w[redacted]d , CWD, normal anatomy, variable presentation, anterior placenta, 268g, 45% EFW   Prenatal History/Complications:  -Vaginal yeast infection in 2nd trimester tx w/ Terazol   Past Medical History: Past Medical History:  Diagnosis Date   Medical history non-contributory     Past Surgical History: Past Surgical History:  Procedure Laterality Date   NO PAST SURGERIES      Obstetrical History: OB History     Gravida  2   Para  1   Term  1   Preterm  0   AB  0   Living  1      SAB  0   IAB  0   Ectopic  0   Multiple  0   Live Births  1           Social History Social History   Socioeconomic History   Marital status: Married    Spouse name: Not on file   Number of children: Not on file   Years of education: Not on file   Highest education level: Not on file  Occupational History   Not on file  Tobacco Use   Smoking status: Never   Smokeless tobacco: Never  Vaping Use   Vaping Use: Never used  Substance and Sexual Activity   Alcohol use: No   Drug use: No   Sexual activity: Not Currently  Other Topics Concern   Not on file  Social History Narrative   Not on file   Social Determinants of Health   Financial Resource Strain: Not on file  Food Insecurity: No Food Insecurity   Worried About Running Out of Food in the Last Year: Never true   Ran Out of Food in the Last Year: Never true  Transportation Needs: No Transportation Needs   Lack of Transportation (Medical): No   Lack of Transportation (Non-Medical): No  Physical Activity: Not on file  Stress: Not on file  Social Connections: Not on  file    Family History: Family History  Family history unknown: Yes    Allergies: No Known Allergies  Medications Prior to Admission  Medication Sig Dispense Refill Last Dose   prenatal vitamin w/FE, FA (PRENATAL 1 + 1) 27-1 MG TABS tablet Take 1 tablet by mouth daily at 12 noon. 30 tablet 11 10/28/2021     Review of Systems   All systems reviewed and negative except as stated in HPI  Blood pressure 99/71, pulse 85, temperature 98.5 F (36.9 C), temperature source Oral, resp. rate 16, height 5\' 2"  (1.575 m), weight 67.8 kg, last menstrual period 01/21/2021, SpO2 98 %. General appearance: alert and no distress Lungs: normal WOB Heart: well perfused Abdomen: soft, non-tender, gravid Extremities: no calf tenderness or significant edema of BLEs Presentation: vertex per MAU note from yesterday Fetal monitoringBaseline: 140 bpm, Variability: Good {> 6 bpm), Accelerations: Reactive, and Decelerations: Absent Uterine activity ctx q 2-3 minutes Dilation: 4 Effacement (%): 60 Station: -3 Exam by:: Amber Scott RN   Prenatal labs: ABO, Rh: AB/Positive/-- (08/01 1643) Antibody: Negative (08/01 1643) Rubella: 9.58 (08/01 1643) RPR: Non  Reactive (11/03 0907)  HBsAg: Negative (08/01 1643)  HIV: Non Reactive (11/03 0907)  GBS: Negative/-- (01/09 1516)  2 hr Glucola normal Genetic screening  normal Anatomy US normal  Prenatal Transfer Tool  Maternal Diabetes: No Genetic Screening: Normal Maternal Ultrasounds/Referrals: Normal Fetal Ultrasounds or other Referrals:  None Maternal Substance Abuse:  No Significant Maternal Medications:  None Significant Maternal Lab Results: None  No results found for this or any previous visit (from the past 24 hour(s)).  Patient Active Problem List   Diagnosis Date Noted   Indication for care in labor or delivery 10/29/2021   Supervision of low-risk pregnancy 04/28/2021    Assessment/Plan:  Amber Moran is a 26 y.o. G2P1001 at [redacted]w[redacted]d here  for SOL  #Labor: Cervix is favorable and labor is progressing well. Will continue with expectant management   #Pain: Fentanyl given. Planning for epidural  #FWB: Cat 1 #ID:  GBS neg #MOF: breast and bottle #MOC: POPs #Circ:  no  Amber Gilding, DO  10/29/2021, 9:22 AM    Attestation of Supervision of Student:  I confirm that I have verified the information documented in the  resident  students note and that I have also personally reperformed the history, physical exam and all medical decision making activities.  I have verified that all services and findings are accurately documented in this student's note; and I agree with management and plan as outlined in the documentation. I have also made any necessary editorial changes.   Amber Moran, [place] for [employer], [employer] [facility] 10/29/2021 10:40 AM

## 2021-10-29 NOTE — MAU Note (Signed)
Pt presented to MAU with CTX every 5 mins since 0200 today. Pt denies VB, LOF, DFM, and complications in the pregnancy. Pt was in MAU yesterday for labor evaluation.   SVE 1.5cm  GBS neg

## 2021-10-29 NOTE — Progress Notes (Signed)
° °  Amber Moran is a 26 y.o. G2P1001 at [redacted]w[redacted]d  admitted for active labor  Subjective: Comfortable, has made minimal change over the past 4 hours but wants to continue on her own, declines AROM at this time.   Objective: Vitals:   10/29/21 1100 10/29/21 1200 10/29/21 1230 10/29/21 1300  BP: 105/66 102/65 103/68 106/72  Pulse: 77 86 82 84  Resp:      Temp:      TempSrc:      SpO2:      Weight:      Height:       No intake/output data recorded.  FHT:  FHR: 140 bpm, variability: moderate,  accelerations:  Present,  decelerations:  Absent UC:   q1-2 SVE:   Dilation: 5.5 Effacement (%): 80 Station: -1 Exam by:: Audrielle Vankuren, CNM   Labs: Lab Results  Component Value Date   WBC 11.7 (H) 10/29/2021   HGB 13.0 10/29/2021   HCT 39.5 10/29/2021   MCV 84.4 10/29/2021   PLT 257 10/29/2021    Assessment / Plan: Spontaneous labor, progressing normally; will defer AROM until she has epidural  Labor: Progressing normally Fetal Wellbeing:  Category I Pain Control:  Labor support without medications; patient may have epidural when she desires Anticipated MOD:  NSVD  Charlesetta Garibaldi Maddyn Lieurance 10/29/2021, 1:08 PM

## 2021-10-29 NOTE — Progress Notes (Addendum)
S: Patient feeling intermittent rectal pressure with contractions. Patient has no questions at this time. Significant other at bedside.   O: Vitals:   10/29/21 2130 10/29/21 2200 10/29/21 2230 10/29/21 2301  BP: 113/64 96/63 107/62 111/64  Pulse: (!) 113 99 (!) 104 (!) 106  Resp: 18 18 18 18   Temp:  (!) 100.9 F (38.3 C)  100.1 F (37.8 C)  TempSrc:  Oral  Oral  SpO2:      Weight:      Height:        FHT:  FHR: 160 bpm, variability: moderate,  accelerations:  Present,  decelerations:  Present variable  UC:   regular, every 1.5-4 minutes SVE:   Dilation: Lip/rim Effacement (%): 90 Station: -1 Exam by:: A. David Rodriquez, SNM  A / P: 26 y.o. G2P1001 [redacted]w[redacted]d spontaneous labor, Protracted active phase discussed IV Pitocin for labor augmentation and patient agrees with the plan -Intermittent  fetal tachycardia, Low grade temp 100.9 PO Tylenol given now 100.1.  -Amp and Gentamicin ordered for Triple I -GBS negative Fetal Wellbeing:  Category 2 Pain Control:  Epidural Anticipated MOD:  NSVD  Christiane Ha, SNM 10/29/2021, 00:04 AM

## 2021-10-29 NOTE — Anesthesia Procedure Notes (Signed)
Epidural Patient location during procedure: OB Start time: 10/29/2021 1:51 PM End time: 10/29/2021 1:57 PM  Staffing Anesthesiologist: Shelton Silvas, MD Performed: anesthesiologist   Preanesthetic Checklist Completed: patient identified, IV checked, site marked, risks and benefits discussed, surgical consent, monitors and equipment checked, pre-op evaluation and timeout performed  Epidural Patient position: sitting Prep: DuraPrep Patient monitoring: heart rate, continuous pulse ox and blood pressure Approach: midline Location: L3-L4 Injection technique: LOR saline  Needle:  Needle type: Tuohy  Needle gauge: 17 G Needle length: 9 cm Catheter type: closed end flexible Catheter size: 20 Guage Test dose: negative and 1.5% lidocaine  Assessment Events: blood not aspirated, injection not painful, no injection resistance and no paresthesia  Additional Notes LOR @ 5  Patient identified. Risks/Benefits/Options discussed with patient including but not limited to bleeding, infection, nerve damage, paralysis, failed block, incomplete pain control, headache, blood pressure changes, nausea, vomiting, reactions to medications, itching and postpartum back pain. Confirmed with bedside nurse the patient's most recent platelet count. Confirmed with patient that they are not currently taking any anticoagulation, have any bleeding history or any family history of bleeding disorders. Patient expressed understanding and wished to proceed. All questions were answered. Sterile technique was used throughout the entire procedure. Please see nursing notes for vital signs. Test dose was given through epidural catheter and negative prior to continuing to dose epidural or start infusion. Warning signs of high block given to the patient including shortness of breath, tingling/numbness in hands, complete motor block, or any concerning symptoms with instructions to call for help. Patient was given instructions on fall  risk and not to get out of bed. All questions and concerns addressed with instructions to call with any issues or inadequate analgesia.    Reason for block:procedure for pain

## 2021-10-29 NOTE — Progress Notes (Addendum)
LABOR PROGRESS NOTE  Amber Moran is a 26 y.o. G2P1001 at [redacted]w[redacted]d  admitted for SOL  Subjective: Pt just received epidural and is doing well. Risks and benefits of AROM were discussed and pt agrees to have AROM at this check.   Objective: BP 104/66    Pulse 76    Temp 99.5 F (37.5 C) (Oral)    Resp 17    Ht 5\' 2"  (1.575 m)    Wt 67.8 kg    LMP 01/21/2021    SpO2 100%    BMI 27.33 kg/m  or  Vitals:   10/29/21 1415 10/29/21 1420 10/29/21 1424 10/29/21 1425  BP: 104/67 100/67 104/66 104/66  Pulse: 77 76 76 76  Resp:      Temp:      TempSrc:      SpO2: 100% 100% 100% 100%  Weight:      Height:         Dilation: 7 Effacement (%): 80 Cervical Position: Posterior Station: -1 Presentation: Vertex Exam by:: Jones, DO FHT: baseline rate 150, moderate varibility, accels present, no decel Toco: ctx q 2 minutes  Labs: Lab Results  Component Value Date   WBC 11.7 (H) 10/29/2021   HGB 13.0 10/29/2021   HCT 39.5 10/29/2021   MCV 84.4 10/29/2021   PLT 257 10/29/2021    Patient Active Problem List   Diagnosis Date Noted   Indication for care in labor or delivery 10/29/2021   Supervision of low-risk pregnancy 04/28/2021    Assessment / Plan:  Labor: Progressing well. AROM done at 145. Continue with expectant management  Fetal Wellbeing:  cat 1 Pain Control:  epidural Anticipated MOD:  SVD  06/28/2021 PGY1, family medicine resident  10/29/2021, 3:02 PM   Attestation of Supervision of Student:  I confirm that I have verified the information documented in the  resident  students note and that I have also personally reperformed the history, physical exam and all medical decision making activities.  I have verified that all services and findings are accurately documented in this student's note; and I agree with management and plan as outlined in the documentation. I have also made any necessary editorial changes.    12/27/2021, CNM [facility] for [facility],  [facility] 10/29/2021 4:25 PM

## 2021-10-29 NOTE — Progress Notes (Signed)
S: Patient feeling contractions in her back that are really uncomfortable. Helped her get into hands and knees position. Patient has no other concerns/questions at this time. Significant other Thuan at bedside. Anticipating the arrival of baby boy [place].   O: Vitals:   10/29/21 1630 10/29/21 1700 10/29/21 1730 10/29/21 1830  BP: (!) 100/43 115/70 (!) 96/44 (!) 88/44  Pulse: 93 95 97 (!) 111  Resp:      Temp: 98.6 F (37 C)     TempSrc: Oral     SpO2:      Weight:      Height:        FHT:  FHR: 150 bpm, variability: moderate,  accelerations:  Present,  decelerations:  Present early UC:   regular, every 1.5-4 minutes SVE:   Dilation: 8 Effacement (%): 80 Station: -1 Exam by:: Nilsa Nutting, RN  A / P: 26 y.o. G2P1 [redacted]w[redacted]d spontaneous labor progressing normally  -GBS negative -Continue expectant management   Fetal Wellbeing:  Category I Pain Control:  Epidural Anticipated MOD:  NSVD  Christiane Ha, SNM 10/29/2021, 7:19 PM

## 2021-10-30 ENCOUNTER — Encounter (HOSPITAL_COMMUNITY): Payer: Self-pay | Admitting: Student

## 2021-10-30 ENCOUNTER — Ambulatory Visit: Payer: Medicaid Other

## 2021-10-30 DIAGNOSIS — Z3A40 40 weeks gestation of pregnancy: Secondary | ICD-10-CM

## 2021-10-30 DIAGNOSIS — O411290 Chorioamnionitis, unspecified trimester, not applicable or unspecified: Secondary | ICD-10-CM | POA: Diagnosis not present

## 2021-10-30 DIAGNOSIS — O411230 Chorioamnionitis, third trimester, not applicable or unspecified: Secondary | ICD-10-CM

## 2021-10-30 DIAGNOSIS — O480 Post-term pregnancy: Secondary | ICD-10-CM

## 2021-10-30 MED ORDER — ZOLPIDEM TARTRATE 5 MG PO TABS: 5.0000 mg | ORAL_TABLET | Freq: Every evening | ORAL | Status: DC | PRN

## 2021-10-30 MED ORDER — IBUPROFEN 600 MG PO TABS
600.0000 mg | ORAL_TABLET | Freq: Four times a day (QID) | ORAL | Status: DC
  Administered 2021-10-30 – 2021-11-01 (×9): 600 mg via ORAL
  Filled 2021-10-30 (×9): qty 1

## 2021-10-30 MED ORDER — DIBUCAINE (PERIANAL) 1 % EX OINT: 1.0000 "application " | TOPICAL_OINTMENT | CUTANEOUS | Status: DC | PRN

## 2021-10-30 MED ORDER — ONDANSETRON HCL 4 MG PO TABS: 4.0000 mg | ORAL_TABLET | ORAL | Status: DC | PRN

## 2021-10-30 MED ORDER — TETANUS-DIPHTH-ACELL PERTUSSIS 5-2.5-18.5 LF-MCG/0.5 IM SUSY: 0.5000 mL | PREFILLED_SYRINGE | Freq: Once | INTRAMUSCULAR | Status: DC

## 2021-10-30 MED ORDER — COCONUT OIL OIL: 1.0000 "application " | TOPICAL_OIL | Status: DC | PRN

## 2021-10-30 MED ORDER — OXYCODONE HCL 5 MG PO TABS: 5.0000 mg | ORAL_TABLET | ORAL | Status: DC | PRN

## 2021-10-30 MED ORDER — SIMETHICONE 80 MG PO CHEW: 80.0000 mg | CHEWABLE_TABLET | ORAL | Status: DC | PRN

## 2021-10-30 MED ORDER — ACETAMINOPHEN 325 MG PO TABS: 650.0000 mg | ORAL_TABLET | ORAL | Status: DC | PRN

## 2021-10-30 MED ORDER — ONDANSETRON HCL 4 MG/2ML IJ SOLN: 4.0000 mg | INTRAMUSCULAR | Status: DC | PRN

## 2021-10-30 MED ORDER — DIPHENHYDRAMINE HCL 25 MG PO CAPS: 25.0000 mg | ORAL_CAPSULE | Freq: Four times a day (QID) | ORAL | Status: DC | PRN

## 2021-10-30 MED ORDER — BENZOCAINE-MENTHOL 20-0.5 % EX AERO
1.0000 "application " | INHALATION_SPRAY | CUTANEOUS | Status: DC | PRN
  Administered 2021-10-30: 1 via TOPICAL
  Filled 2021-10-30: qty 56

## 2021-10-30 MED ORDER — SENNOSIDES-DOCUSATE SODIUM 8.6-50 MG PO TABS
2.0000 | ORAL_TABLET | Freq: Every day | ORAL | Status: DC
  Administered 2021-10-31 – 2021-11-01 (×2): 2 via ORAL
  Filled 2021-10-30 (×2): qty 2

## 2021-10-30 MED ORDER — INFLUENZA VAC SPLIT QUAD 0.5 ML IM SUSY
0.5000 mL | PREFILLED_SYRINGE | INTRAMUSCULAR | Status: AC
  Administered 2021-10-31: 0.5 mL via INTRAMUSCULAR
  Filled 2021-10-30: qty 0.5

## 2021-10-30 MED ORDER — WITCH HAZEL-GLYCERIN EX PADS: 1.0000 "application " | MEDICATED_PAD | CUTANEOUS | Status: DC | PRN

## 2021-10-30 MED ORDER — PRENATAL MULTIVITAMIN CH
1.0000 | ORAL_TABLET | Freq: Every day | ORAL | Status: DC
  Administered 2021-10-30 – 2021-10-31 (×2): 1 via ORAL
  Filled 2021-10-30 (×2): qty 1

## 2021-10-30 NOTE — Lactation Note (Signed)
This note was copied from a baby's chart. Lactation Consultation Note  Patient Name: Amber Moran [id number] Date: 10/30/2021 Reason for consult: Follow-up assessment;1st time breastfeeding;Mother's request;Term Age:28 hours  Mom requested assistance.   Baby sleepy swaddled in crib. Reviewed breast massage and hand expression, unable to express colostrum presently, but encouraged Mom to continue. LC removed swaddle and baby woke. Assisted in laid back cross cradle hold.  Baby made little effort to open his mouth. After a couple attempts, changed baby to football hold on right breast.  Baby asleep.  Placed baby sleeping STS on Mom's chest.   Recommended keeping baby STS until he is consistently breastfeeding.  Encouraged parents to wait on formula supplementation for now.  Mom to call for assistance when baby starts cueing he is hungry.  Maternal Data Does the patient have breastfeeding experience prior to this delivery?: No  Feeding Mother's Current Feeding Choice: Breast Milk and Formula  LATCH Score Latch: Too sleepy or reluctant, no latch achieved, no sucking elicited.  Audible Swallowing: None  Type of Nipple: Everted at rest and after stimulation  Comfort (Breast/Nipple): Soft / non-tender  Hold (Positioning): Full assist, staff holds infant at breast  LATCH Score: 4   Interventions Interventions: Breast feeding basics reviewed;Assisted with latch;Skin to skin;Breast massage;Hand express;Reverse pressure;Breast compression;Adjust position;Support pillows;Position options Consult Status Consult Status: Follow-up Date: 10/31/21 Follow-up type: In-patient    Broadus John 10/30/2021, 8:39 AM

## 2021-10-30 NOTE — Discharge Summary (Signed)
Postpartum Discharge Summary      Patient Name: Amber Moran DOB: 13-Jan-1996 MRN: [id number]  Date of admission: 10/29/2021 Delivery date:10/30/2021  Delivering provider: WHITE, Ubaldo Glassing D  Date of discharge: 11/01/2021  Admitting diagnosis: Indication for care in labor or delivery [O75.9] Intrauterine pregnancy: [redacted]w[redacted]d    Secondary diagnosis:  Principal Problem:   Indication for care in labor or delivery Active Problems:   Supervision of low-risk pregnancy   Chorioamnionitis  Additional problems: none    Discharge diagnosis: Term Pregnancy Delivered                                              Post partum procedures: none Augmentation: AROM and Pitocin Complications: Intrauterine Inflammation or infection (Chorioamniotis)  Hospital course: Onset of Labor With Vaginal Delivery      26y.o. yo G2P1001 at 448w2das admitted in Latent Labor on 10/29/2021. Patient had a labor course remarkable for a protracted first stage as well as a dx of Triple I with a dose of Amp and Gent given.  Membrane Rupture Time/Date: 2:51 PM ,10/29/2021   Delivery Method:Vaginal, Spontaneous  Episiotomy: None  Lacerations:  2nd degree  Patient had an uncomplicated postpartum course.  She is ambulating, tolerating a regular diet, passing flatus, and urinating well. Patient is discharged home in stable condition on 11/01/21.  Newborn Data: Birth date:10/30/2021  Birth time:3:45 AM  Gender:Female  Living status:Living  Apgars:4 ,8  Weight:3720 g (8lb 3.2oz)  Magnesium Sulfate received: No BMZ received: No Rhophylac:N/A MMR:N/A T-DaP:Given prenatally Flu: No Transfusion:No  Physical exam  Vitals:   10/31/21 0532 10/31/21 1400 10/31/21 2045 11/01/21 0500  BP: 94/72 99/72 93/64  100/70  Pulse: 72 88 79 77  Resp: 16 17 16 16   Temp: 97.9 F (36.6 C) 98.7 F (37.1 C) 98.8 F (37.1 C) 98.1 F (36.7 C)  TempSrc: Oral Oral Oral Oral  SpO2: 97% 100% 99% 99%  Weight:      Height:       General: alert,  cooperative, and no distress Lochia: appropriate Uterine Fundus: firm Incision: N/A DVT Evaluation: No cords or calf tenderness. No significant calf/ankle edema. Labs: Lab Results  Component Value Date   WBC 11.7 (H) 10/29/2021   HGB 13.0 10/29/2021   HCT 39.5 10/29/2021   MCV 84.4 10/29/2021   PLT 257 10/29/2021   CMP Latest Ref Rng & Units 10/06/2012  Glucose 70 - 99 mg/dL 83  BUN 6 - 23 mg/dL 8  Creatinine 0.50 - 1.10 mg/dL 0.52  Sodium 135 - 145 mEq/L 139  Potassium 3.5 - 5.1 mEq/L 3.7  Chloride 96 - 112 mEq/L 102  CO2 19 - 32 mEq/L 24  Calcium 8.4 - 10.5 mg/dL 9.2   Edinburgh Score: Edinburgh Postnatal Depression Scale Screening Tool 10/30/2021  I have been able to laugh and see the funny side of things. 0  I have looked forward with enjoyment to things. 0  I have blamed myself unnecessarily when things went wrong. 0  I have been anxious or worried for no good reason. 0  I have felt scared or panicky for no good reason. 1  Things have been getting on top of me. 0  I have been so unhappy that I have had difficulty sleeping. 0  I have felt sad or miserable. 0  I have been so unhappy that  I have been crying. 0  The thought of harming myself has occurred to me. 0  Edinburgh Postnatal Depression Scale Total 1     After visit meds:  Allergies as of 11/01/2021   No Known Allergies      Medication List     TAKE these medications    acetaminophen 325 MG tablet Commonly known as: Tylenol Take 2 tablets (650 mg total) by mouth every 4 (four) hours as needed (for pain scale < 4).   ibuprofen 600 MG tablet Commonly known as: ADVIL Take 1 tablet (600 mg total) by mouth every 6 (six) hours.   prenatal vitamin w/FE, FA 27-1 MG Tabs tablet Take 1 tablet by mouth daily at 12 noon.         Discharge home in stable condition Infant Feeding: Bottle and Breast Infant Disposition:home with mother Discharge instruction: per After Visit Summary and Postpartum  booklet. Activity: Advance as tolerated. Pelvic rest for 6 weeks.  Diet: routine diet Future Appointments: Future Appointments  Date Time Provider [place]  12/11/2021 10:15 AM Gabriel Carina, CNM [facility]   Follow up Visit:  Myrtis Ser, CNM  P [facility] Admin Pool Please schedule this patient for Postpartum visit in: 6 weeks with the following provider: Any provider  In-Person  For C/S patients schedule nurse incision check in weeks 2 weeks: no  Low risk pregnancy complicated by: none  Delivery mode:  SVD  Anticipated Birth Control:  OCPs  PP Procedures needed: none  Schedule Integrated BH visit: no   Renard Matter, MD, MPH OB Fellow, Faculty Practice

## 2021-10-30 NOTE — Lactation Note (Signed)
This note was copied from a baby's chart. Lactation Consultation Note  Patient Name: Boy Keola Rabanales [id number] Date: 10/30/2021 Reason for consult: Initial assessment;1st time breastfeeding;Term Age:28 hours  LC in to visit with P2 Mom (first time breastfeeding) of term baby.  Baby apneic at birth and Neonatal Code Blue called.  Baby responded to NRP and was left STS with mother after 5 mins.  Mom resting in bed when LC and her food tray arrived.  Baby sleeping soundly swaddled in crib. [place] talking about the importance of STS to encourage baby to breastfeed.  Baby hasn't fed yet per Mom.   Offered to assist, but Mom declined until after she eats her breakfast.  LC recommended Mom to call out for help with feeding.  LC noted a bottle of formula at bedside.  Encouraged breastfeeding before any formula feeding.     Interventions Interventions: Breast feeding basics reviewed;Skin to [employer] brochure  Consult Status Consult Status: Follow-up Date: 10/30/21 Follow-up type: In-patient    Broadus John 10/30/2021, 7:57 AM

## 2021-10-30 NOTE — Progress Notes (Addendum)
S: Patient resting more comfortable with her epidural again.   O: Vitals:   10/30/21 0100 10/30/21 0130 10/30/21 0201 10/30/21 0230  BP: 108/66 109/67 (!) 102/56 (!) 94/52  Pulse: (!) 105 (!) 106 90 100  Resp: 18 18 18    Temp:  100 F (37.8 C)    TempSrc:  Oral    SpO2:      Weight:      Height:        FHT:  FHR: 155 bpm, variability: moderate,  accelerations:  Present,  decelerations:  Present Variable  UC:   irregular, every 1.5-5 minutes SVE:   Dilation: Lip/rim Effacement (%): 90 Station: -1 Exam by:: A. Janete Quilling, SNM  A / P: 25 y.o. G2P1001 spontaneous labor, Protracted active phase -Discussed placing IUPC for pitocin titration due to cervix unchanged and patient agrees with the plan -GBS negative -Triple I- Amp and Gentamicin given. Will continue to monitor  Fetal Wellbeing:  Category I Pain Control:  Epidural Anticipated MOD:  NSVD  [phone] 10/30/2021, 2:42 AM

## 2021-10-30 NOTE — Anesthesia Postprocedure Evaluation (Signed)
Anesthesia Post Note  Patient: Amber Moran  Procedure(s) Performed: AN AD HOC LABOR EPIDURAL     Patient location during evaluation: Mother Baby Anesthesia Type: Epidural Level of consciousness: awake and alert Pain management: pain level controlled Vital Signs Assessment: post-procedure vital signs reviewed and stable Respiratory status: spontaneous breathing, nonlabored ventilation and respiratory function stable Cardiovascular status: stable Postop Assessment: no headache, no backache and epidural receding Anesthetic complications: no   No notable events documented.  Last Vitals:  Vitals:   10/30/21 1500 10/30/21 1900  BP: 91/68 91/64  Pulse: 95 80  Resp: 17 18  Temp: 37 C 37.1 C  SpO2: 100% 98%    Last Pain:  Vitals:   10/30/21 1900  TempSrc: Oral  PainSc: 0-No pain   Pain Goal:                   Salome Arnt

## 2021-10-31 NOTE — Lactation Note (Signed)
This note was copied from a baby's chart. Lactation Consultation Note  Patient Name: Boy Miley Blanchett [id number] Date: 10/31/2021 Reason for consult: Follow-up assessment;Term Age:28 hours   P2 mother whose infant is now 55 hours old.  This is a term baby at 40+2 weeks.  Mother did not breast feed her first child.  Her current feeding preference is breast/formula.  Mother has not attempted to breast feed at all the last 24 hours.  Discussed the importance of frequent feedings for breast stimulation and to help ensure a good milk supply.  Mother has been formula feeding large amounts.  Reviewed the supplementation guidelines and suggested she latch with every feeding prior to giving any formula supplementation.  Mother verbalized understanding.  Mother had no questions/concerns at this time.  She has a DEBP for home use and may be interested in beginning to pump.  I advised to pump after breast feeding for 15 minutes.   Father present.   Maternal Data Has patient been taught Hand Expression?: Yes Does the patient have breastfeeding experience prior to this delivery?: No  Feeding Mother's Current Feeding Choice: Breast Milk and Formula  LATCH Score                    Lactation Tools Discussed/Used    Interventions Interventions: Breast feeding basics reviewed;Education  Discharge Discharge Education: Engorgement and breast care Pump: Personal  Consult Status Consult Status: Complete Date: 10/31/21 Follow-up type: Call as needed    Kallen Delatorre R Ancil Dewan 10/31/2021, 8:58 AM

## 2021-10-31 NOTE — Progress Notes (Signed)
Post Partum Day 1 Subjective: Doing well. No acute events overnight. Pain is controlled and bleeding is appropriate. She is eating, drinking, and ambulating without issue. She has passed flatus. She is bottle feeding and attempting to breast feed. Can see lactation today if she desires. She has no other concerns at this time.  Objective: Blood pressure 94/72, pulse 72, temperature 97.9 F (36.6 C), temperature source Oral, resp. rate 16, height 5\' 2"  (1.575 m), weight 67.8 kg, last menstrual period 01/21/2021, SpO2 97 %, unknown if currently breastfeeding.  Physical Exam:  General: alert and no distress Uterine Fundus: firm DVT Evaluation: No cords or calf tenderness. No significant calf/ankle edema.  Recent Labs    10/29/21 0935  HGB 13.0  HCT 39.5    Assessment/Plan: Amber Moran is a 26 y.o. G2P2002 on PPD# 1 s/p SVD.  Progressing well. Meeting postpartum milestones. VSS. Continue routine postpartum care.  Feeding: bottle and breast Contraception:OCP Circumcision: no  Dispo:home tomorrow   LOS: 2 days   34, DO  10/31/2021, 9:31 AM

## 2021-11-01 MED ORDER — IBUPROFEN 600 MG PO TABS: 600.0000 mg | ORAL_TABLET | Freq: Four times a day (QID) | ORAL | 0 refills | Status: DC

## 2021-11-01 MED ORDER — ACETAMINOPHEN 325 MG PO TABS: 650.0000 mg | ORAL_TABLET | ORAL | Status: DC | PRN

## 2021-11-04 ENCOUNTER — Inpatient Hospital Stay (HOSPITAL_COMMUNITY): Admission: AD | Admit: 2021-11-04 | Payer: Medicaid Other | Source: Home / Self Care | Admitting: Family Medicine

## 2021-11-04 ENCOUNTER — Inpatient Hospital Stay (HOSPITAL_COMMUNITY): Payer: Medicaid Other

## 2021-11-11 ENCOUNTER — Telehealth (HOSPITAL_COMMUNITY): Payer: Self-pay | Admitting: *Deleted

## 2021-11-11 NOTE — Telephone Encounter (Addendum)
Voicemail box full, no message left.  Duffy Rhody, RN 11-11-2021 at 2:27pm

## 2021-11-25 ENCOUNTER — Ambulatory Visit: Payer: Self-pay

## 2021-11-25 NOTE — Lactation Note (Signed)
This note was copied from a baby's chart. Lactation Consultation Note  Patient Name: Amber Moran'F Date: 11/25/2021 Reason for consult: Infant weight loss;Initial assessment (UTI) Age:28 wk.o.  P23, 66 week old infant admitted with fever and weight gain being monitored closely.  Mother states she breastfeeds approx 4 times per day and occasionally pumps.  Baby primarily formula feeds 2-3 oz.  She expresses 2-3 oz with pumping sessions at home.  Mother hand expressed good flow and attempted latching.  Positioning adequate.  Baby was not interested in feeding at this time due to recent bottle.  Mother states since baby has not been feeling well he has been taking less volume. Discussed the importance of emptying her breasts on a regular basis to maintain her milk supply.  If she breastfeeds 4 times per day, suggest pumping at least 4 times per day so her breasts are stimulated at least 8 times per day.  She may pump more frequently if desired.   Set up DEBP.  Reviewed cleaning and milk storage.  Mother pumped 65 ml.  Mother gave baby 10 ml of breastmilk and he fell asleep.     Maternal Data Has patient been taught Hand Expression?: Yes Does the patient have breastfeeding experience prior to this delivery?: Yes  Feeding Mother's Current Feeding Choice: Breast Milk and Formula   Lactation Tools Discussed/Used  DEBP  Interventions Interventions: Education;DEBP  Discharge Pump: DEBP;Personal (Evenflow)  Consult Status Consult Status: PRN    Hardie Pulley 11/25/2021, 7:31 AM

## 2021-12-11 ENCOUNTER — Other Ambulatory Visit: Payer: Self-pay

## 2021-12-11 ENCOUNTER — Ambulatory Visit (INDEPENDENT_AMBULATORY_CARE_PROVIDER_SITE_OTHER): Payer: Medicaid Other | Admitting: Student

## 2021-12-11 MED ORDER — NORETHINDRONE 0.35 MG PO TABS
1.0000 | ORAL_TABLET | Freq: Every day | ORAL | 11 refills | Status: DC
Start: 2021-12-11 — End: 2024-02-23

## 2021-12-11 NOTE — Progress Notes (Signed)
? ? ?Post Partum Visit Note ? ?Amber Moran is a 26 y.o. G64P2002 female who presents for a postpartum visit. She is 6 weeks postpartum following a normal spontaneous vaginal delivery.  I have fully reviewed the prenatal and intrapartum course. The delivery was at [redacted]w[redacted]d gestational weeks.  Anesthesia: epidural. Postpartum course has been uncomplicated. Baby is doing well. Baby is feeding by both breast and bottle - [place] . Bleeding no bleeding. Bowel function is normal. Bladder function is normal. Patient is not sexually active. Contraception method is oral progesterone-only contraceptive. Postpartum depression screening: negative. ? ? ?The pregnancy intention screening data noted above was reviewed. Potential methods of contraception were discussed. The patient elected to proceed with No data recorded. ? ? Edinburgh Postnatal Depression Scale - 12/11/21 1044   ? ?  ? Edinburgh Postnatal Depression Scale:  In the Past 7 Days  ? I have been able to laugh and see the funny side of things. 0   ? I have looked forward with enjoyment to things. 0   ? I have blamed myself unnecessarily when things went wrong. 0   ? I have been anxious or worried for no good reason. 0   ? I have felt scared or panicky for no good reason. 0   ? Things have been getting on top of me. 0   ? I have been so unhappy that I have had difficulty sleeping. 0   ? I have felt sad or miserable. 0   ? I have been so unhappy that I have been crying. 0   ? The thought of harming myself has occurred to me. 0   ? Edinburgh Postnatal Depression Scale Total 0   ? ?  ?  ? ?  ? ? ?There are no preventive care reminders to display for this patient. ? ?The following portions of the patient's history were reviewed and updated as appropriate: allergies, current medications, past family history, past medical history, past social history, past surgical history, and problem list. ? ?Review of Systems ?Pertinent items noted in HPI and remainder of comprehensive ROS  otherwise negative. ? ?Objective:  ?BP 99/73   Pulse 83   Wt 133 lb (60.3 kg)   LMP 01/21/2021   Breastfeeding Yes   BMI 24.33 kg/m?   ? ?General:  alert, cooperative, appears stated age, and no distress  ? Breasts:  normal, expressible breast milk   ?Lungs: Normal effort  ?Heart:  Regular rate and rhythm   ?Abdomen: Soft, nontender    ?Wound N/A  ?GU exam:  not indicated  ?     ?Assessment:  ? ?Encounter for postpartum care of lactating mother ? ?Normal postpartum exam.  ? ?Plan:  ? ?Essential components of care per ACOG recommendations: ? ?1.  Mood and well being: Patient with negative depression screening today. Reviewed local resources for support.  ?- Patient tobacco use? No.   ?- hx of drug use? No.   ? ?2. Infant care and feeding:  ?-Patient currently breastmilk feeding? Yes. Reviewed importance of draining breast regularly to support lactation.  ?-Social determinants of health (SDOH) reviewed in EPIC. No concerns.The following needs were identified: None ? ?3. Sexuality, contraception and birth spacing ?- Patient does not want a pregnancy in the next year.  Desired family size is unknown at this time. ?- Reviewed reproductive life planning. Reviewed contraceptive methods based on pt preferences and effectiveness.  Patient desired Oral Contraceptive today.   ?- Discussed birth spacing of 18 months ? ?  4. Sleep and fatigue ?-Encouraged family/partner/community support of 4 hrs of uninterrupted sleep to help with mood and fatigue ? ?5. Physical Recovery  ?- Discussed patients delivery and complications. She describes her labor as good. ?- Patient had a Vaginal, no problems at delivery. Patient had a 2nd degree laceration. Perineal healing reviewed. Patient expressed understanding ?- Patient has urinary incontinence? No. ?- Patient is safe to resume physical and sexual activity ? ?6.  Health Maintenance ?- HM due items addressed Yes ?- Last pap smear  ?Diagnosis  ?Date Value Ref Range Status  ?04/28/2021    Final  ? - Negative for Intraepithelial Lesions or Malignancy (NILM)  ?04/28/2021 - Benign reactive/reparative changes  Final  ? Pap smear not done at today's visit.  ?-Breast Cancer screening indicated? No.  ? ?7. Chronic Disease/Pregnancy Condition follow up: None ? ?- PCP follow up ? ?Corlis Hove, NP ?Center for [employer], [facility]  ?

## 2024-02-23 ENCOUNTER — Ambulatory Visit: Admitting: *Deleted

## 2024-02-23 DIAGNOSIS — Z3200 Encounter for pregnancy test, result unknown: Secondary | ICD-10-CM

## 2024-02-23 DIAGNOSIS — O3680X0 Pregnancy with inconclusive fetal viability, not applicable or unspecified: Secondary | ICD-10-CM

## 2024-02-23 DIAGNOSIS — Z3201 Encounter for pregnancy test, result positive: Secondary | ICD-10-CM | POA: Diagnosis not present

## 2024-02-23 LAB — POCT PREGNANCY, URINE: Preg Test, Ur: POSITIVE — AB

## 2024-02-23 NOTE — Progress Notes (Signed)
 Pt submitted urine for pregnancy test which is positive. I called and informed her of test result. She reports LMP 01/05/24 which yields EDD 10/11/24, now [redacted]w[redacted]d. Pt denies vaginal bleeding or abdominal pain. She was advised to go to [facility] for evaluation if she develops heavy vaginal bleeding or abdominal/pelvic pain. Ultrasound for viability scheduled on 6/9 @ 9:35 am. She will be scheduled for prenatal care in this office. Pt voiced understanding of all information and instructions given.

## 2024-03-06 ENCOUNTER — Other Ambulatory Visit: Payer: Self-pay

## 2024-03-06 ENCOUNTER — Ambulatory Visit

## 2024-03-06 DIAGNOSIS — Z3A08 8 weeks gestation of pregnancy: Secondary | ICD-10-CM

## 2024-03-06 DIAGNOSIS — Z3491 Encounter for supervision of normal pregnancy, unspecified, first trimester: Secondary | ICD-10-CM

## 2024-03-06 DIAGNOSIS — O3680X0 Pregnancy with inconclusive fetal viability, not applicable or unspecified: Secondary | ICD-10-CM

## 2024-03-09 ENCOUNTER — Encounter: Payer: Self-pay | Admitting: Emergency Medicine

## 2024-03-09 ENCOUNTER — Ambulatory Visit
Admission: RE | Admit: 2024-03-09 | Discharge: 2024-03-09 | Disposition: A | Discharge status: Home / Self Care | Source: Ambulatory Visit

## 2024-03-09 VITALS — BP 102/68 | HR 93 | Temp 99.0°F | Resp 16 | Wt 132.9 lb

## 2024-03-09 DIAGNOSIS — L500 Allergic urticaria: Secondary | ICD-10-CM

## 2024-03-09 MED ORDER — CETIRIZINE HCL 10 MG PO TABS: 10.0000 mg | ORAL_TABLET | Freq: Every day | ORAL | 0 refills | Status: DC

## 2024-03-09 MED ORDER — CETIRIZINE HCL 10 MG PO TABS: 20.0000 mg | ORAL_TABLET | Freq: Two times a day (BID) | ORAL | 0 refills | Status: DC

## 2024-03-09 NOTE — ED Provider Notes (Signed)
 [facility]  Note:  This document was prepared using Conservation officer, historic buildings and may include unintentional dictation errors.  MRN: [id number] DOB: [birth year]  Subjective:   Amber Moran is a 28 y.o. female presenting for allergic, pruritic rash all over body x 2 days.  Patient denies any known allergy or contact with allergic substance.  Patient denies any change in soaps, conditioner, shampoo, perfume, any other skin care products.  Patient has not taken any over-the-counter medication or antihistamines to treat symptoms.  Patient not used any topical ointment or cream.  Patient denies any past history of allergic reaction.  Patient states that she is not weeks pregnant and did not know what she was able to take for symptoms.  No shortness of breath, chest pain, weakness, dizziness, wheezing, stridor, swelling to lips or face, difficulty swallowing.  No current facility-administered medications for this encounter.  Current Outpatient Medications:    prenatal vitamin w/FE, FA (PRENATAL 1 + 1) 27-1 MG TABS tablet, Take 1 tablet by mouth daily at 12 noon., Disp: 30 tablet, Rfl: 11   acetaminophen  (TYLENOL ) 325 MG tablet, Take 2 tablets (650 mg total) by mouth every 4 (four) hours as needed (for pain scale < 4). (Patient not taking: Reported on 02/23/2024), Disp: , Rfl:    cetirizine (ZYRTEC) 10 MG tablet, Take 2 tablets (20 mg total) by mouth 2 (two) times daily., Disp: 60 tablet, Rfl: 0   No Known Allergies  Past Medical History:  Diagnosis Date   Medical history non-contributory      Past Surgical History:  Procedure Laterality Date   NO PAST SURGERIES      Family History  Family history unknown: Yes    Social History   Tobacco Use   Smoking status: Never    Passive exposure: Never   Smokeless tobacco: Never  Vaping Use   Vaping status: Never Used  Substance Use Topics   Alcohol use: No   Drug use: No    ROS Refer to HPI for ROS details.  Objective:    Vitals: BP 102/68 (BP Location: Left Arm)   Pulse 93   Temp 99 F (37.2 C) (Oral)   Resp 16   Wt 132 lb 15 oz (60.3 kg)   SpO2 98%   Breastfeeding No   BMI 24.31 kg/m   Physical Exam Vitals and nursing note reviewed.  Constitutional:      General: She is not in acute distress.    Appearance: She is well-developed. She is not ill-appearing or toxic-appearing.  HENT:     Head: Normocephalic and atraumatic.     Nose: Nose normal.     Mouth/Throat:     Mouth: Mucous membranes are moist.     Pharynx: Oropharynx is clear. No oropharyngeal exudate or posterior oropharyngeal erythema.   Cardiovascular:     Rate and Rhythm: Normal rate.  Pulmonary:     Effort: Pulmonary effort is normal. No respiratory distress.     Breath sounds: No stridor. No wheezing.   Skin:    General: Skin is warm and dry.     Findings: Erythema and rash present. Rash is urticarial.   Neurological:     General: No focal deficit present.     Mental Status: She is alert and oriented to person, place, and time.   Psychiatric:        Mood and Affect: Mood normal.        Behavior: Behavior normal.     Procedures  No results found for this or any previous visit (from the past 24 hours).  No results found.   Assessment and Plan :     Discharge Instructions       1. Allergic urticaria (Primary) - cetirizine (ZYRTEC) 10 MG tablet; Take 2 tablets (20 mg total) by mouth 2 (two) times daily for acute allergic urticaria. - Please take 2 tablets when you get medication today and 2 tablets again tonight before bed.  If symptoms have resolved by tomorrow morning then just take 1 pill in the morning and 1 pill in the evening for the remaining 4 days to prevent allergic reaction from reoccurring. - Typical treatment of acute allergic reaction with allergic urticaria is antihistamine medication and oral or IM injection of steroids however during first trimester pregnancy steroids are contraindicated. - If  symptoms persist past 12 weeks of pregnancy you may use steroids for short-term treatment of allergic reaction.  Please return to urgent care or follow-up in ER for further evaluation management if symptoms persist. - If symptoms become progressively worse such as swelling to the lips or tongue, decreased ability to swallow, difficulty breathing, stridor, wheezing, follow-up immediately in the ER for further evaluation and management of symptoms.       Rawlin Reaume B Ladislao Cohenour   [name], [place], [place] 03/09/24 1043

## 2024-03-09 NOTE — Discharge Instructions (Addendum)
  1. Allergic urticaria (Primary) - cetirizine (ZYRTEC) 10 MG tablet; Take 2 tablets (20 mg total) by mouth 2 (two) times daily for acute allergic urticaria. - Please take 2 tablets when you get medication today and 2 tablets again tonight before bed.  If symptoms have resolved by tomorrow morning then just take 1 pill in the morning and 1 pill in the evening for the remaining 4 days to prevent allergic reaction from reoccurring. - Typical treatment of acute allergic reaction with allergic urticaria is antihistamine medication and oral or IM injection of steroids however during first trimester pregnancy steroids are contraindicated. - If symptoms persist past 12 weeks of pregnancy you may use steroids for short-term treatment of allergic reaction.  Please return to urgent care or follow-up in ER for further evaluation management if symptoms persist. - If symptoms become progressively worse such as swelling to the lips or tongue, decreased ability to swallow, difficulty breathing, stridor, wheezing, follow-up immediately in the ER for further evaluation and management of symptoms.

## 2024-03-09 NOTE — ED Triage Notes (Signed)
 Pt presents c/o rash all over her body x 2 days. Pt denies contact with any known allergens.

## 2024-03-21 ENCOUNTER — Telehealth

## 2024-03-21 DIAGNOSIS — Z3689 Encounter for other specified antenatal screening: Secondary | ICD-10-CM

## 2024-03-21 DIAGNOSIS — Z3490 Encounter for supervision of normal pregnancy, unspecified, unspecified trimester: Secondary | ICD-10-CM | POA: Insufficient documentation

## 2024-03-21 MED ORDER — BLOOD PRESSURE KIT DEVI: 1.0000 | Freq: Once | 0 refills | Status: AC

## 2024-03-21 MED ORDER — GOJJI WEIGHT SCALE MISC: 1.0000 | Freq: Once | 0 refills | Status: AC

## 2024-03-21 MED ORDER — PRENATAL PLUS VITAMIN/MINERAL 27-1 MG PO TABS: 1.0000 | ORAL_TABLET | Freq: Every day | ORAL | 11 refills | Status: AC

## 2024-03-21 NOTE — Progress Notes (Signed)
 New OB Intake  I connected with Amber Moran on 03/21/24 at 9:15 AM EDT by MyChart Video Visit and verified that I am speaking with the correct person using two identifiers. Nurse is located at [facility] and pt is located at home.  I discussed the limitations, risks, security and privacy concerns of performing an evaluation and management service by telephone and the availability of in person appointments. I also discussed with the patient that there may be a patient responsible charge related to this service. The patient expressed understanding and agreed to proceed.  I explained I am completing New OB Intake today. We discussed EDD 10/11/24 by LMP, confirmed by US  at [redacted]w[redacted]d Pt is G3P2002. I reviewed her allergies, medications and Medical/Surgical/OB history.    Patient Active Problem List   Diagnosis Date Noted   Supervision of low-risk pregnancy 03/21/2024    Concerns addressed today Recent allergic reaction, seen at urgent care on 03/09/24. Concerned this was related to OTC prenatal vitamin. Occurred shortly after taking, however this was not the first time taking vitamin. No food allergies known. Will send prescription prenatal vitamin. Patient to monitor symptoms and report any repeat pruritic rash.   Delivery Plans Plans to deliver at [facility]. Discussed the nature of our practice with multiple providers including residents and students. Due to the size of the practice, the delivering provider may not be the same as those providing prenatal care.   Patient is not interested in water birth.  MyChart/Babyscripts MyChart access verified. I explained pt will have some visits in office and some virtually. Babyscripts instructions given and order placed.   Blood Pressure Cuff/Weight Scale Blood pressure cuff ordered for patient to pick-up from [employer]. Explained after first prenatal appt pt will check weekly and document in Babyscripts. Patient does not have weight scale; order sent to  [facility], patient may track weight weekly in Babyscripts.  Anatomy US  Explained first scheduled US  will be around 19 weeks. Message sent to MFM to schedule. Prefers Monday AM appointments around 9 AM.  Is patient a CenteringPregnancy candidate?  Declined Prefers Babyscripts schedule   Is patient a Mom+Baby Combined Care candidate?  Not a candidate    Is patient a candidate for Babyscripts Optimization? Yes, patient accepted    First visit review I reviewed new OB appt with patient. Explained pt will be seen by Lynwood Solomons, MD at first visit. Discussed [place] genetic screening with patient. Horizon previously done, negative. Declines Panorama. Routine prenatal labs needed. Planning to update Pap smear at new OB.  Last Pap Diagnosis  Date Value Ref Range Status  04/28/2021   Final   - Negative for Intraepithelial Lesions or Malignancy (NILM)  04/28/2021 - Benign reactive/reparative changes  Final    Vernell FORBES Ruddle, RN 03/21/2024  9:35 AM

## 2024-03-21 NOTE — Addendum Note (Signed)
 Addended by: HONORE VERNELL BRAVO on: 03/21/2024 05:07 PM   Modules accepted: Level of Service

## 2024-03-28 ENCOUNTER — Ambulatory Visit (INDEPENDENT_AMBULATORY_CARE_PROVIDER_SITE_OTHER): Payer: Self-pay | Admitting: Obstetrics & Gynecology

## 2024-03-28 ENCOUNTER — Other Ambulatory Visit (HOSPITAL_COMMUNITY)
Admission: RE | Admit: 2024-03-28 | Discharge: 2024-03-28 | Disposition: A | Discharge status: Home / Self Care | Source: Ambulatory Visit | Attending: Obstetrics & Gynecology | Admitting: Obstetrics & Gynecology

## 2024-03-28 ENCOUNTER — Other Ambulatory Visit: Payer: Self-pay

## 2024-03-28 ENCOUNTER — Encounter: Payer: Self-pay | Admitting: Obstetrics & Gynecology

## 2024-03-28 VITALS — BP 99/66 | HR 81 | Wt 124.5 lb

## 2024-03-28 DIAGNOSIS — Z3490 Encounter for supervision of normal pregnancy, unspecified, unspecified trimester: Secondary | ICD-10-CM | POA: Diagnosis present

## 2024-03-28 DIAGNOSIS — Z3A12 12 weeks gestation of pregnancy: Secondary | ICD-10-CM | POA: Diagnosis not present

## 2024-03-28 DIAGNOSIS — Z3491 Encounter for supervision of normal pregnancy, unspecified, first trimester: Secondary | ICD-10-CM

## 2024-03-29 LAB — CBC/D/PLT+RPR+RH+ABO+RUBIGG...
Antibody Screen: NEGATIVE
Basophils Absolute: 0 10*3/uL (ref 0.0–0.2)
Basos: 0 %
EOS (ABSOLUTE): 0 10*3/uL (ref 0.0–0.4)
Eos: 0 %
HCV Ab: NONREACTIVE
HIV Screen 4th Generation wRfx: NONREACTIVE
Hematocrit: 40.5 % (ref 34.0–46.6)
Hemoglobin: 13 g/dL (ref 11.1–15.9)
Hepatitis B Surface Ag: NEGATIVE
Immature Grans (Abs): 0 10*3/uL (ref 0.0–0.1)
Immature Granulocytes: 0 %
Lymphocytes Absolute: 1.2 10*3/uL (ref 0.7–3.1)
Lymphs: 15 %
MCH: 29.2 pg (ref 26.6–33.0)
MCHC: 32.1 g/dL (ref 31.5–35.7)
MCV: 91 fL (ref 79–97)
Monocytes Absolute: 0.4 10*3/uL (ref 0.1–0.9)
Monocytes: 6 %
Neutrophils Absolute: 5.9 10*3/uL (ref 1.4–7.0)
Neutrophils: 79 %
Platelets: 254 10*3/uL (ref 150–450)
RBC: 4.45 x10E6/uL (ref 3.77–5.28)
RDW: 13.1 % (ref 11.7–15.4)
RPR Ser Ql: NONREACTIVE
Rh Factor: POSITIVE
Rubella Antibodies, IGG: 10.4 {index} (ref >0.99)
WBC: 7.5 10*3/uL (ref 3.4–10.8)

## 2024-03-29 LAB — HCV INTERPRETATION

## 2024-03-29 LAB — GC/CHLAMYDIA PROBE AMP (~~LOC~~) NOT AT ARMC
Chlamydia: NEGATIVE
Comment: NEGATIVE
Comment: NORMAL
Neisseria Gonorrhea: NEGATIVE

## 2024-03-29 NOTE — Progress Notes (Signed)
  Subjective:    Amber Moran is a G3P2002 [redacted]w[redacted]d being seen today for her first obstetrical visit.  Her obstetrical history is significant for 2 prior SVD. Patient does intend to breast feed. Pregnancy history fully reviewed.  Patient reports no complaints.  Vitals:   03/28/24 0925  BP: 99/66  Pulse: 81  Weight: 124 lb 8 oz (56.5 kg)    HISTORY: OB History  Gravida Para Term Preterm AB Living  3 2 2  0 0 2  SAB IAB Ectopic Multiple Live Births  0 0 0 0 2    # Outcome Date GA Lbr Len/2nd Weight Sex Type Anes PTL Lv  3 Current           2 Term 10/30/21 [redacted]w[redacted]d 25:36 / 00:09 8 lb 3.2 oz (3.72 kg) M Vag-Spont EPI  LIV  1 Term 01/30/16 [redacted]w[redacted]d 18:12 / 01:59 5 lb 10.5 oz (2.565 kg) M Vag-Spont EPI  LIV   Past Medical History:  Diagnosis Date   Medical history non-contributory    Past Surgical History:  Procedure Laterality Date   NO PAST SURGERIES     Family History  Problem Relation Age of Onset   Healthy Mother    Bladder Cancer Father      Exam    Uterus:   10   Pelvic Exam:    Perineum:    Vulva:    Vagina:     pH:    Cervix:    Adnexa:    Bony Pelvis:   System: Breast:  Inspection negative   Skin: normal coloration and turgor, no rashes    Neurologic: oriented, normal mood   Extremities: normal strength, tone, and muscle mass   HEENT PERRLA   Mouth/Teeth mucous membranes moist, pharynx normal without lesions and dental hygiene good   Neck supple   Cardiovascular: regular rate and rhythm, no murmurs or gallops   Respiratory:  appears well, vitals normal, no respiratory distress, acyanotic, normal RR, chest clear, no wheezing, crepitations, rhonchi, normal symmetric air entry   Abdomen: soft, non-tender; bowel sounds normal; no masses,  no organomegaly   Urinary:       Assessment:    Pregnancy: [id number] Patient Active Problem List   Diagnosis Date Noted   Supervision of low-risk pregnancy 03/21/2024        Plan:     Initial labs drawn. Prenatal  vitamins. Problem list reviewed and updated. Genetic Screening discussed : declined.  Ultrasound discussed; fetal survey: ordered.  Follow up in 4 weeks. 50% of 30 min visit spent on counseling and coordination of care.     Lynwood Solomons 03/29/2024

## 2024-03-30 LAB — CULTURE, OB URINE

## 2024-03-30 LAB — URINE CULTURE, OB REFLEX

## 2024-04-05 ENCOUNTER — Encounter: Payer: Self-pay | Admitting: *Deleted

## 2024-04-20 LAB — HEMOGLOBIN A1C
Est. average glucose Bld gHb Est-mCnc: 94 mg/dL
Hgb A1c MFr Bld: 4.9 % (ref 4.8–5.6)

## 2024-04-20 LAB — SPECIMEN STATUS REPORT

## 2024-04-27 ENCOUNTER — Other Ambulatory Visit: Payer: Self-pay

## 2024-04-27 ENCOUNTER — Ambulatory Visit: Admitting: Obstetrics & Gynecology

## 2024-04-27 ENCOUNTER — Encounter: Payer: Self-pay | Admitting: Obstetrics & Gynecology

## 2024-04-27 VITALS — BP 98/62 | HR 89 | Wt 128.0 lb

## 2024-04-27 DIAGNOSIS — Z3A16 16 weeks gestation of pregnancy: Secondary | ICD-10-CM | POA: Diagnosis not present

## 2024-04-27 DIAGNOSIS — Z3492 Encounter for supervision of normal pregnancy, unspecified, second trimester: Secondary | ICD-10-CM

## 2024-04-27 NOTE — Progress Notes (Signed)
   PRENATAL VISIT NOTE  Subjective:  Amber Moran is a 28 y.o. G3P2002 at [redacted]w[redacted]d being seen today for ongoing prenatal care.  She is currently monitored for the following issues for this low-risk pregnancy and has Supervision of low-risk pregnancy on their problem list.  Patient reports no complaints.  Contractions: Not present. Vag. Bleeding: None.  Movement: Absent. Denies leaking of fluid.   The following portions of the patient's history were reviewed and updated as appropriate: allergies, current medications, past family history, past medical history, past social history, past surgical history and problem list.   Objective:    Vitals:   04/27/24 1129  BP: 98/62  Pulse: 89  Weight: 128 lb (58.1 kg)    Fetal Status:  Fetal Heart Rate (bpm): 155   Movement: Absent    General: Alert, oriented and cooperative. Patient is in no acute distress.  Skin: Skin is warm and dry. No rash noted.   Cardiovascular: Normal heart rate noted  Respiratory: Normal respiratory effort, no problems with respiration noted  Abdomen: Soft, gravid, appropriate for gestational age.  Pain/Pressure: Absent     Pelvic: Cervical exam deferred        Extremities: Normal range of motion.  Edema: None  Mental Status: Normal mood and affect. Normal behavior. Normal judgment and thought content.   Assessment and Plan:  Pregnancy: G3P2002 at [redacted]w[redacted]d 1. [redacted] weeks gestation of pregnancy (Primary) 2. Encounter for supervision of low-risk pregnancy in second trimester - AFP, Serum, Open Spina Bifida Anatomy scan scheduled. No other complaints or concerns.  Routine obstetric precautions reviewed.  Please refer to After Visit Summary for other counseling recommendations.   Return in about 4 weeks (around 05/25/2024) for OFFICE OB VISIT (MD or APP).  Future Appointments  Date Time Provider Department Center  05/22/2024  9:00 AM [facility] PROVIDER 1 [facility]  05/22/2024  9:30 AM [facility] US2 [facility]  05/22/2024 10:55  AM Fredirick Glenys RAMAN, MD [facility]    Gloris Hugger, MD

## 2024-04-29 LAB — AFP, SERUM, OPEN SPINA BIFIDA
AFP MoM: 0.67
AFP Value: 24.9 ng/mL
Gest. Age on Collection Date: 16.1 wk
Maternal Age At EDD: 30.8 a
OSBR Risk 1 IN: 10000
Test Results:: NEGATIVE
Weight: 128 [lb_av]

## 2024-05-01 ENCOUNTER — Ambulatory Visit: Payer: Self-pay | Admitting: Obstetrics & Gynecology

## 2024-05-22 ENCOUNTER — Ambulatory Visit: Admitting: Family Medicine

## 2024-05-22 ENCOUNTER — Ambulatory Visit (HOSPITAL_BASED_OUTPATIENT_CLINIC_OR_DEPARTMENT_OTHER): Attending: Obstetrics and Gynecology | Admitting: Maternal & Fetal Medicine

## 2024-05-22 ENCOUNTER — Ambulatory Visit (HOSPITAL_BASED_OUTPATIENT_CLINIC_OR_DEPARTMENT_OTHER)

## 2024-05-22 VITALS — BP 92/56 | HR 74

## 2024-05-22 VITALS — BP 95/63 | HR 69 | Wt 127.0 lb

## 2024-05-22 DIAGNOSIS — Z3A19 19 weeks gestation of pregnancy: Secondary | ICD-10-CM | POA: Diagnosis not present

## 2024-05-22 DIAGNOSIS — Z3689 Encounter for other specified antenatal screening: Secondary | ICD-10-CM

## 2024-05-22 DIAGNOSIS — Z3490 Encounter for supervision of normal pregnancy, unspecified, unspecified trimester: Secondary | ICD-10-CM

## 2024-05-22 DIAGNOSIS — Z363 Encounter for antenatal screening for malformations: Secondary | ICD-10-CM | POA: Diagnosis present

## 2024-05-22 DIAGNOSIS — Z3492 Encounter for supervision of normal pregnancy, unspecified, second trimester: Secondary | ICD-10-CM

## 2024-05-22 NOTE — Progress Notes (Signed)
 Patient information  Patient Name: Amber Moran  Patient MRN:   [id number]  Referring practice: MFM Referring Provider: [facility])  Problem List   Patient Active Problem List   Diagnosis Date Noted   Supervision of low-risk pregnancy 03/21/2024   Maternal Fetal Medicine Consult Amber Moran is a 28 y.o. [id number] at [redacted]w[redacted]d here for ultrasound and consultation.  She declined carrier screening.  aFP was negative.  Horizon screening was negative in 2022.  The patient reports that her pregnancy is doing well and she has no concerns today.  Her blood pressure is on the low side but she denies any symptoms consistent with hypotension.  I discussed that the ultrasound appears normal today and she should follow-up with her doctor.  Sonographic findings Single intrauterine pregnancy at 19w 5d. Fetal cardiac activity:  Observed and appears normal. Presentation: Cephalic. The anatomic structures that were well seen appear normal without evidence of soft markers. The anatomic survey is complete.  Fetal biometry shows the estimated fetal weight at the 67 percentile. Amniotic fluid: Within normal limits.  MVP: 5.05 cm. Placenta: Anterior. Adnexa: No abnormality visualized. Cervical length: 5 cm.  There are limitations of prenatal ultrasound such as the inability to detect certain abnormalities due to poor visualization. Various factors such as fetal position, gestational age and maternal body habitus may increase the difficulty in visualizing the fetal anatomy.    Recommendations -EDD should be 10/11/2024 based on  LMP  (01/05/24). -Ultrasound is not able to rule out all genetic disorders and antibody screening should be offered again if not previously done. -No further ultrasounds are recommended at this time based on the current indications. If future indications arise (e.g. size/date discrepancy on fundal height, gestational diabetes or hypertension) and an ultrasound is  to be desired at our MFM office, please send a referral.  Ultrasound is not able to rule out all genetic disorders and antibody screening should be offered again if not previously done.  Review of Systems: A review of systems was performed and was negative except per HPI   Past Obstetrical History:  OB History  Gravida Para Term Preterm AB Living  3 2 2  0 0 2  SAB IAB Ectopic Multiple Live Births  0 0 0 0 2    # Outcome Date GA Lbr Len/2nd Weight Sex Type Anes PTL Lv  3 Current           2 Term 10/30/21 [redacted]w[redacted]d 25:36 / 00:09 8 lb 3.2 oz (3.72 kg) M Vag-Spont EPI  LIV  1 Term 01/30/16 [redacted]w[redacted]d 18:12 / 01:59 5 lb 10.5 oz (2.565 kg) M Vag-Spont EPI  LIV     Past Medical History:  Past Medical History:  Diagnosis Date   Medical history non-contributory      Past Surgical History:    Past Surgical History:  Procedure Laterality Date   NO PAST SURGERIES       Home Medications:   Current Outpatient Medications on File Prior to Visit  Medication Sig Dispense Refill   Prenatal Vit-Fe Fumarate-FA (PRENATAL PLUS VITAMIN/MINERAL) 27-1 MG TABS Take 1 tablet by mouth daily. 30 tablet 11   acetaminophen  (TYLENOL ) 325 MG tablet Take 2 tablets (650 mg total) by mouth every 4 (four) hours as needed (for pain scale < 4). (Patient not taking: Reported on 04/27/2024)     cetirizine  (ZYRTEC ) 10 MG tablet Take 2 tablets (20 mg total) by mouth 2 (two) times daily. (Patient not taking:  Reported on 04/27/2024) 60 tablet 0   No current facility-administered medications on file prior to visit.      Allergies:   No Known Allergies   Physical Exam:   Vitals:   05/22/24 0857  BP: (!) 92/56  Pulse: 74   Sitting comfortably on the sonogram table Nonlabored breathing Normal rate and rhythm Abdomen is nontender  Thank you for the opportunity to be involved with this patient's care. Please let us  know if we can be of any further assistance.   15 minutes of time was spent reviewing the patient's chart  including labs, imaging and documentation.  At least 50% of this time was spent with direct patient care discussing the diagnosis, management and prognosis of her care.  Amber Moran MFM, [facility]   05/22/2024  10:19 AM

## 2024-05-22 NOTE — Progress Notes (Signed)
   PRENATAL VISIT NOTE  Subjective:  Amber Moran is a 28 y.o. G3P2002 at [redacted]w[redacted]d being seen today for ongoing prenatal care.  She is currently monitored for the following issues for this low-risk pregnancy and has Supervision of low-risk pregnancy on their problem list.  Patient reports no complaints.  Contractions: Not present. Vag. Bleeding: None.  Movement: Present. Denies leaking of fluid.   The following portions of the patient's history were reviewed and updated as appropriate: allergies, current medications, past family history, past medical history, past social history, past surgical history and problem list.   Objective:    Vitals:   05/22/24 1059  BP: 95/63  Pulse: 69  Weight: 127 lb (57.6 kg)    Fetal Status:  Fetal Heart Rate (bpm): 154   Movement: Present    General: Alert, oriented and cooperative. Patient is in no acute distress.  Skin: Skin is warm and dry. No rash noted.   Cardiovascular: Normal heart rate noted  Respiratory: Normal respiratory effort, no problems with respiration noted  Abdomen: Soft, gravid, appropriate for gestational age.  Pain/Pressure: Absent     Pelvic: Cervical exam deferred        Extremities: Normal range of motion.  Edema: None  Mental Status: Normal mood and affect. Normal behavior. Normal judgment and thought content.   Assessment and Plan:  Pregnancy: G3P2002 at [redacted]w[redacted]d 1. [redacted] weeks gestation of pregnancy (Primary)   2. Encounter for supervision of low-risk pregnancy in second trimester Continue prenatal care.  Preterm labor symptoms and general obstetric precautions including but not limited to vaginal bleeding, contractions, leaking of fluid and fetal movement were reviewed in detail with the patient. Please refer to After Visit Summary for other counseling recommendations.   Return in 8 weeks (on 07/17/2024) for 28 wk labs.  Future Appointments  Date Time Provider Department Center  06/19/2024 11:15 AM Milly Olam DELENA EDDY [facility]  07/18/2024  8:35 AM Nicholaus Jorene FORBES DEVONNA [facility]  07/18/2024  9:00 AM [facility] LAB [facility]  08/08/2024 10:55 AM [facility]GENERAL 2 [facility]  08/22/2024  9:35 AM [facility]GENERAL 2 [facility]  09/05/2024 11:15 AM [facility]GENERAL 2 [facility]  09/19/2024  9:35 AM [facility]-GENERAL 2 [facility]  09/25/2024 10:15 AM [facility]-GENERAL 2 [facility]  10/02/2024 10:15 AM [facility]-GENERAL 2 [facility]  10/09/2024 10:55 AM [facility]-GENERAL 2 [facility]  10/16/2024 10:55 AM [facility]-GENERAL 2 [facility]    Glenys GORMAN Birk, MD

## 2024-06-19 ENCOUNTER — Encounter: Admitting: Advanced Practice Midwife

## 2024-07-17 ENCOUNTER — Other Ambulatory Visit: Payer: Self-pay | Admitting: *Deleted

## 2024-07-17 DIAGNOSIS — Z3490 Encounter for supervision of normal pregnancy, unspecified, unspecified trimester: Secondary | ICD-10-CM

## 2024-07-18 ENCOUNTER — Ambulatory Visit (INDEPENDENT_AMBULATORY_CARE_PROVIDER_SITE_OTHER): Admitting: Physician Assistant

## 2024-07-18 ENCOUNTER — Other Ambulatory Visit: Payer: Self-pay

## 2024-07-18 ENCOUNTER — Other Ambulatory Visit

## 2024-07-18 VITALS — BP 95/57 | HR 81 | Wt 136.5 lb

## 2024-07-18 DIAGNOSIS — Z3492 Encounter for supervision of normal pregnancy, unspecified, second trimester: Secondary | ICD-10-CM | POA: Diagnosis not present

## 2024-07-18 DIAGNOSIS — Z23 Encounter for immunization: Secondary | ICD-10-CM

## 2024-07-18 DIAGNOSIS — Z3A27 27 weeks gestation of pregnancy: Secondary | ICD-10-CM

## 2024-07-18 NOTE — Addendum Note (Signed)
 Addended byBETHA BARTHOLOMEW BARMAN on: 07/18/2024 09:59 AM   Modules accepted: Orders

## 2024-07-18 NOTE — Progress Notes (Signed)
   PRENATAL VISIT NOTE  Subjective:  Amber Moran is a 28 y.o. G3P2002 at [redacted]w[redacted]d being seen today for ongoing prenatal care.  She is currently monitored for the following issues for this low-risk pregnancy and has Supervision of low-risk pregnancy on their problem list.  Patient reports no complaints.  Contractions: Irritability. Vag. Bleeding: None.  Movement: Present. Denies leaking of fluid.   The following portions of the patient's history were reviewed and updated as appropriate: allergies, current medications, past family history, past medical history, past social history, past surgical history and problem list.   Objective:    Vitals:   07/18/24 0836  BP: (!) 95/57  Pulse: 81  Weight: 136 lb 8 oz (61.9 kg)    Fetal Status:  Fetal Heart Rate (bpm): 152   Movement: Present    General: Alert, oriented and cooperative. Patient is in no acute distress.  Skin: Skin is warm and dry. No rash noted.   Cardiovascular: Normal heart rate noted  Respiratory: Normal respiratory effort, no problems with respiration noted  Abdomen: Soft, gravid, appropriate for gestational age.  Pain/Pressure: Present (lower abdominal)     Pelvic: Cervical exam deferred        Extremities: Normal range of motion.  Edema: None  Mental Status: Normal mood and affect. Normal behavior. Normal judgment and thought content.   Assessment and Plan:  Pregnancy: G3P2002 at [redacted]w[redacted]d  1. Encounter for supervision of low-risk pregnancy in second trimester (Primary) Patient doing well, feeling regular fetal movement BP, FHR, FH appropriate   2. [redacted] weeks gestation of pregnancy Anticipatory guidance about next visits/weeks of pregnancy given.   Preterm labor symptoms and general obstetric precautions including but not limited to vaginal bleeding, contractions, leaking of fluid and fetal movement were reviewed in detail with the patient.  Please refer to After Visit Summary for other counseling recommendations.   Return  in about 2 weeks (around 08/01/2024) for LOB.  Future Appointments  Date Time Provider Department Center  08/08/2024 10:55 AM Wallace Joesph LABOR, [place] [facility]  08/22/2024  9:35 AM Delores Nidia CROME, FNP [facility]  09/05/2024 11:15 AM Cleatus Moccasin, MD [facility]  09/19/2024  9:35 AM Wallace Joesph LABOR, PA [facility]  09/25/2024 10:15 AM Zina Jerilynn LABOR, MD [facility]  10/02/2024 10:15 AM [facility]GENERAL 2 [facility]  10/09/2024 10:55 AM [facility]-GENERAL 2 [facility]  10/16/2024 10:55 AM [facility]-GENERAL 2 [facility]    Jorene FORBES Moats, PA-C

## 2024-07-19 ENCOUNTER — Other Ambulatory Visit

## 2024-07-19 ENCOUNTER — Other Ambulatory Visit: Payer: Self-pay | Admitting: *Deleted

## 2024-07-19 DIAGNOSIS — Z3A28 28 weeks gestation of pregnancy: Secondary | ICD-10-CM

## 2024-07-19 LAB — RPR: RPR Ser Ql: NONREACTIVE

## 2024-07-19 LAB — CBC
Hematocrit: 39.6 % (ref 34.0–46.6)
Hemoglobin: 13.1 g/dL (ref 11.1–15.9)
MCH: 30.5 pg (ref 26.6–33.0)
MCHC: 33.1 g/dL (ref 31.5–35.7)
MCV: 92 fL (ref 79–97)
Platelets: 248 x10E3/uL (ref 150–450)
RBC: 4.3 x10E6/uL (ref 3.77–5.28)
RDW: 12.8 % (ref 11.7–15.4)
WBC: 9.2 x10E3/uL (ref 3.4–10.8)

## 2024-07-19 LAB — HIV ANTIBODY (ROUTINE TESTING W REFLEX): HIV Screen 4th Generation wRfx: NONREACTIVE

## 2024-07-20 ENCOUNTER — Ambulatory Visit: Payer: Self-pay | Admitting: Family Medicine

## 2024-07-20 DIAGNOSIS — Z3493 Encounter for supervision of normal pregnancy, unspecified, third trimester: Secondary | ICD-10-CM

## 2024-07-20 LAB — CBC
Hematocrit: 35.6 % (ref 34.0–46.6)
Hemoglobin: 12.1 g/dL (ref 11.1–15.9)
MCH: 30.3 pg (ref 26.6–33.0)
MCHC: 34 g/dL (ref 31.5–35.7)
MCV: 89 fL (ref 79–97)
Platelets: 229 x10E3/uL (ref 150–450)
RBC: 3.99 x10E6/uL (ref 3.77–5.28)
RDW: 12.4 % (ref 11.7–15.4)
WBC: 9.2 x10E3/uL (ref 3.4–10.8)

## 2024-07-20 LAB — GLUCOSE TOLERANCE, 2 HOURS W/ 1HR
Glucose, 1 hour: 119 mg/dL (ref 70–179)
Glucose, 2 hour: 101 mg/dL (ref 70–152)
Glucose, Fasting: 76 mg/dL (ref 70–91)

## 2024-07-20 LAB — RPR: RPR Ser Ql: NONREACTIVE

## 2024-07-20 LAB — HIV ANTIBODY (ROUTINE TESTING W REFLEX): HIV Screen 4th Generation wRfx: NONREACTIVE

## 2024-08-07 NOTE — Progress Notes (Unsigned)
 PRENATAL VISIT NOTE  Subjective:  Amber Moran is a 28 y.o. G3P2002 at [redacted]w[redacted]d being seen today for ongoing prenatal care.  She is currently monitored for the following issues for this low-risk pregnancy and has Supervision of low-risk pregnancy on their problem list.  Patient reports {[id number]}.   .  .   . Denies leaking of fluid.   The following portions of the patient's history were reviewed and updated as appropriate: allergies, current medications, past family history, past medical history, past social history, past surgical history and problem list.   Objective:   There were no vitals filed for this visit.  Fetal Status:           General: Alert, oriented and cooperative. Patient is in no acute distress.  Skin: Skin is warm and dry. No rash noted.   Cardiovascular: Normal heart rate noted  Respiratory: Normal respiratory effort, no problems with respiration noted  Abdomen: Soft, gravid, appropriate for gestational age.        Pelvic: Cervical exam deferred        Extremities: Normal range of motion.     Mental Status: Normal mood and affect. Normal behavior. Normal judgment and thought content.      03/28/2024   10:57 AM 10/24/2021   12:15 PM 10/15/2021    8:36 AM  Depression screen PHQ 2/9  Decreased Interest 1 0 0  Down, Depressed, Hopeless 0 0 0  PHQ - 2 Score 1 0 0  Altered sleeping 0 0 0  Tired, decreased energy 2 0 0  Change in appetite 0 0 0  Feeling bad or failure about yourself  0 0 0  Trouble concentrating 0 0 0  Moving slowly or fidgety/restless 0 0 0  Suicidal thoughts 0 0 0  PHQ-9 Score 3  0  0      Data saved with a previous flowsheet row definition        03/28/2024   10:59 AM 10/24/2021   12:15 PM 10/15/2021    8:36 AM 10/06/2021    2:44 PM  GAD 7 : Generalized Anxiety Score  Nervous, Anxious, on Edge 0 0 0 0  Control/stop worrying 0 0 0 0  Worry too much - different things 0 0 0 0  Trouble relaxing 0 0 0 0  Restless 0 0 0 0  Easily annoyed or  irritable 0 0 0 0  Afraid - awful might happen 0 0 0 0  Total GAD 7 Score 0 0 0 0  Anxiety Difficulty    Not difficult at all    Assessment and Plan:  Pregnancy: G3P2002 at [redacted]w[redacted]d 1. Encounter for supervision of low-risk pregnancy in third trimester (Primary) Prenatal course reviewed BP, HR, FHR within normal limits Feeling regular FM   2. [redacted] weeks gestation of pregnancy ***  Preterm labor symptoms and general obstetric precautions including but not limited to vaginal bleeding, contractions, leaking of fluid and fetal movement were reviewed in detail with the patient. Please refer to After Visit Summary for other counseling recommendations.   No follow-ups on file.  Future Appointments  Date Time Provider Department Center  08/08/2024 10:55 AM Wallace Joesph LABOR, [place] [facility]  08/22/2024  9:35 AM Delores Nidia CROME, FNP [facility]  09/05/2024 11:15 AM Cleatus Moccasin, MD [facility]  09/19/2024  9:35 AM Wallace Joesph LABOR, PA [facility]  09/25/2024 10:15 AM Zina Jerilynn LABOR, MD [facility]  10/02/2024 10:15 AM Ilean Norleen GAILS, MD [facility]  10/09/2024 10:55 AM Anyanwu, Gloris LABOR, MD [facility]  10/16/2024 10:55 AM Nicholaus Burnard HERO, MD [facility]    Joesph LABOR Sear, PA

## 2024-08-08 ENCOUNTER — Other Ambulatory Visit: Payer: Self-pay

## 2024-08-08 ENCOUNTER — Ambulatory Visit: Admitting: Family Medicine

## 2024-08-08 VITALS — BP 99/64 | HR 89 | Wt 142.8 lb

## 2024-08-08 DIAGNOSIS — Z3493 Encounter for supervision of normal pregnancy, unspecified, third trimester: Secondary | ICD-10-CM | POA: Diagnosis not present

## 2024-08-08 DIAGNOSIS — Z3A30 30 weeks gestation of pregnancy: Secondary | ICD-10-CM

## 2024-08-22 ENCOUNTER — Other Ambulatory Visit: Payer: Self-pay

## 2024-08-22 ENCOUNTER — Ambulatory Visit (INDEPENDENT_AMBULATORY_CARE_PROVIDER_SITE_OTHER): Admitting: Obstetrics and Gynecology

## 2024-08-22 VITALS — BP 96/68 | HR 84 | Wt 142.6 lb

## 2024-08-22 DIAGNOSIS — Z3493 Encounter for supervision of normal pregnancy, unspecified, third trimester: Secondary | ICD-10-CM | POA: Diagnosis not present

## 2024-08-22 DIAGNOSIS — Z3A32 32 weeks gestation of pregnancy: Secondary | ICD-10-CM

## 2024-08-22 NOTE — Progress Notes (Signed)
   PRENATAL VISIT NOTE  Subjective:  Amber Moran is a 28 y.o. G3P2002 at [redacted]w[redacted]d being seen today for ongoing prenatal care.  She is currently monitored for the following issues for this low-risk pregnancy and has Supervision of low-risk pregnancy on their problem list.  Patient reports no complaints.   . Vag. Bleeding: None.  Movement: Present. Denies leaking of fluid.   The following portions of the patient's history were reviewed and updated as appropriate: allergies, current medications, past family history, past medical history, past social history, past surgical history and problem list.   Objective:   Vitals:   08/22/24 0934  BP: 96/68  Pulse: 84  Weight: 142 lb 9.6 oz (64.7 kg)    Fetal Status:  Fetal Heart Rate (bpm): 143 Fundal Height: 33 cm Movement: Present    General: Alert, oriented and cooperative. Patient is in no acute distress.  Skin: Skin is warm and dry. No rash noted.   Cardiovascular: Normal heart rate noted  Respiratory: Normal respiratory effort, no problems with respiration noted  Abdomen: Soft, gravid, appropriate for gestational age.  Pain/Pressure: Absent     Pelvic: Cervical exam deferred        Extremities: Normal range of motion.  Edema: None  Mental Status: Normal mood and affect. Normal behavior. Normal judgment and thought content.   Assessment and Plan:  Pregnancy: G3P2002 at [redacted]w[redacted]d 1. Encounter for supervision of low-risk pregnancy in third trimester (Primary) BP and FHR normal Doing well, feeling regular movement    2. [redacted] weeks gestation of pregnancy Considering Nexplanon  for birth control  Discussed RSV vaccine, consider for next visit   Preterm labor symptoms and general obstetric precautions including but not limited to vaginal bleeding, contractions, leaking of fluid and fetal movement were reviewed in detail with the patient. Please refer to After Visit Summary for other counseling recommendations.   Return in two weeks for OB visit    Future Appointments  Date Time Provider Department Center  09/05/2024 11:15 AM Cleatus Moccasin, MD [facility]  09/19/2024  9:35 AM Wallace Joesph LABOR, PA [facility]  09/25/2024 10:15 AM Zina Jerilynn LABOR, MD [facility]  10/02/2024 10:15 AM Ilean Norleen GAILS, MD [facility]  10/09/2024 10:55 AM Herchel, Gloris LABOR, MD [facility]  10/16/2024 10:55 AM Nicholaus Burnard HERO, MD [facility]    Nidia Daring, FNP

## 2024-09-05 ENCOUNTER — Ambulatory Visit: Admitting: Obstetrics and Gynecology

## 2024-09-05 ENCOUNTER — Other Ambulatory Visit: Payer: Self-pay

## 2024-09-05 ENCOUNTER — Encounter: Payer: Self-pay | Admitting: Obstetrics and Gynecology

## 2024-09-05 VITALS — BP 96/64 | HR 98 | Wt 145.0 lb

## 2024-09-05 DIAGNOSIS — Z2911 Encounter for prophylactic immunotherapy for respiratory syncytial virus (RSV): Secondary | ICD-10-CM

## 2024-09-05 DIAGNOSIS — Z3A34 34 weeks gestation of pregnancy: Secondary | ICD-10-CM

## 2024-09-05 DIAGNOSIS — Z3493 Encounter for supervision of normal pregnancy, unspecified, third trimester: Secondary | ICD-10-CM

## 2024-09-05 NOTE — Progress Notes (Signed)
   PRENATAL VISIT NOTE  Subjective:  Amber Moran is a 28 y.o. G3P2002 at [redacted]w[redacted]d being seen today for ongoing prenatal care.  She is currently monitored for the following issues for this low-risk pregnancy and has Supervision of low-risk pregnancy on their problem list.  Patient reports no complaints.  Contractions: Not present. Vag. Bleeding: None.  Movement: Present. Denies leaking of fluid.   The following portions of the patient's history were reviewed and updated as appropriate: allergies, current medications, past family history, past medical history, past social history, past surgical history and problem list.   Objective:   Vitals:   09/05/24 1116  BP: 96/64  Pulse: 98  Weight: 145 lb (65.8 kg)    Fetal Status:  Fetal Heart Rate (bpm): 137 Fundal Height: 35 cm Movement: Present Presentation: Vertex  General: Alert, oriented and cooperative. Patient is in no acute distress.  Skin: Skin is warm and dry. No rash noted.   Cardiovascular: Normal heart rate noted  Respiratory: Normal respiratory effort, no problems with respiration noted  Abdomen: Soft, gravid, appropriate for gestational age.  Pain/Pressure: Absent     Pelvic: Cervical exam deferred        Extremities: Normal range of motion.  Edema: None  Mental Status: Normal mood and affect. Normal behavior. Normal judgment and thought content.    Assessment and Plan:  Pregnancy: G3P2002 at [redacted]w[redacted]d 1. Encounter for supervision of low-risk pregnancy in third trimester (Primary) Cultures next visit. Historically delivers around 40w.  RSV offered - pt accepts.    2. Pregnancy with 34 completed weeks gestation   Preterm labor symptoms and general obstetric precautions including but not limited to vaginal bleeding, contractions, leaking of fluid and fetal movement were reviewed in detail with the patient. Please refer to After Visit Summary for other counseling recommendations.   Return in about 2 weeks (around 09/19/2024) for  OB VISIT, MD or APP, babyscripts.  Future Appointments  Date Time Provider Department Center  09/19/2024  9:35 AM Wallace Joesph LABOR, [place] [facility]  09/25/2024 10:15 AM Hoyle Garre, NP [facility]  10/02/2024 10:15 AM Ilean Norleen GAILS, MD [facility]  10/09/2024 10:55 AM Herchel Gloris LABOR, MD [facility]  10/16/2024 10:55 AM Nicholaus Burnard HERO, MD [facility]    Vina Solian, MD

## 2024-09-19 ENCOUNTER — Other Ambulatory Visit (HOSPITAL_COMMUNITY)
Admission: RE | Admit: 2024-09-19 | Discharge: 2024-09-19 | Disposition: A | Discharge status: Home / Self Care | Source: Ambulatory Visit | Attending: Family Medicine | Admitting: Family Medicine

## 2024-09-19 ENCOUNTER — Encounter: Payer: Self-pay | Admitting: Family Medicine

## 2024-09-19 ENCOUNTER — Other Ambulatory Visit: Payer: Self-pay

## 2024-09-19 ENCOUNTER — Ambulatory Visit (INDEPENDENT_AMBULATORY_CARE_PROVIDER_SITE_OTHER): Admitting: Family Medicine

## 2024-09-19 VITALS — BP 97/70 | HR 83 | Wt 147.3 lb

## 2024-09-19 DIAGNOSIS — Z3493 Encounter for supervision of normal pregnancy, unspecified, third trimester: Secondary | ICD-10-CM | POA: Insufficient documentation

## 2024-09-19 DIAGNOSIS — Z3A36 36 weeks gestation of pregnancy: Secondary | ICD-10-CM | POA: Insufficient documentation

## 2024-09-19 NOTE — Progress Notes (Signed)
 "  PRENATAL VISIT NOTE  Subjective:  Amber Moran is Moran 28 y.o. G3P2002 at [redacted]w[redacted]d being seen today for ongoing prenatal care.  She is currently monitored for the following issues for this low-risk pregnancy and has Supervision of low-risk pregnancy on their problem list.  Patient reports no complaints.  Contractions: Not present. Vag. Bleeding: None.  Movement: Present. Denies leaking of fluid.   The following portions of the patient's history were reviewed and updated as appropriate: allergies, current medications, past family history, past medical history, past social history, past surgical history and problem list.   Objective:   Vitals:   09/19/24 0948  BP: 97/70  Pulse: 83  Weight: 147 lb 4.8 oz (66.8 kg)    Fetal Status:  Fetal Heart Rate (bpm): 145 Fundal Height: 38 cm Movement: Present    General: Alert, oriented and cooperative. Patient is in no acute distress.  Skin: Skin is warm and dry. No rash noted.   Cardiovascular: Normal heart rate noted  Respiratory: Normal respiratory effort, no problems with respiration noted  Abdomen: Soft, gravid, appropriate for gestational age.  Pain/Pressure: Absent     Pelvic: Cervical exam deferred        Extremities: Normal range of motion.  Edema: None  Mental Status: Normal mood and affect. Normal behavior. Normal judgment and thought content.      03/28/2024   10:57 AM 10/24/2021   12:15 PM 10/15/2021    8:36 AM  Depression screen PHQ 2/9  Decreased Interest 1 0 0  Down, Depressed, Hopeless 0 0 0  PHQ - 2 Score 1 0 0  Altered sleeping 0 0 0  Tired, decreased energy 2 0 0  Change in appetite 0 0 0  Feeling bad or failure about yourself  0 0 0  Trouble concentrating 0 0 0  Moving slowly or fidgety/restless 0 0 0  Suicidal thoughts 0 0 0  PHQ-9 Score 3  0  0      Data saved with Moran previous flowsheet row definition        03/28/2024   10:59 AM 10/24/2021   12:15 PM 10/15/2021    8:36 AM 10/06/2021    2:44 PM  GAD 7 : Generalized  Anxiety Score  Nervous, Anxious, on Edge 0 0 0 0  Control/stop worrying 0 0 0 0  Worry too much - different things 0 0 0 0  Trouble relaxing 0 0 0 0  Restless 0 0 0 0  Easily annoyed or irritable 0 0 0 0  Afraid - awful might happen 0 0 0 0  Total GAD 7 Score 0 0 0 0  Anxiety Difficulty    Not difficult at all    Assessment and Plan:  Pregnancy: G3P2002 at [redacted]w[redacted]d 1. Encounter for supervision of low-risk pregnancy in third trimester (Primary) Prenatal course reviewed BP, HR, FHR within normal limits Feeling regular FM   2. [redacted] weeks gestation of pregnancy GC/CT and GBS today Fundal height appropriate for gestational age.   Preterm labor symptoms and general obstetric precautions including but not limited to vaginal bleeding, contractions, leaking of fluid and fetal movement were reviewed in detail with the patient. Please refer to After Visit Summary for other counseling recommendations.   Return in about 1 week (around 09/26/2024) for LOB.  Future Appointments  Date Time Provider Department Center  09/25/2024 10:15 AM Hoyle Garre, NP [facility]  10/02/2024 10:15 AM Ilean Norleen GAILS, MD [facility]  10/09/2024 10:55 AM Herchel, Amber  A, MD [facility]  10/16/2024 10:55 AM Nicholaus Burnard HERO, MD [facility]    Joesph DELENA Sear, PA "

## 2024-09-19 NOTE — Patient Instructions (Signed)

## 2024-09-20 LAB — GC/CHLAMYDIA PROBE AMP (~~LOC~~) NOT AT ARMC
Chlamydia: NEGATIVE
Comment: NEGATIVE
Comment: NORMAL
Neisseria Gonorrhea: NEGATIVE

## 2024-09-23 LAB — CULTURE, BETA STREP (GROUP B ONLY): Strep Gp B Culture: NEGATIVE

## 2024-09-25 ENCOUNTER — Other Ambulatory Visit: Payer: Self-pay

## 2024-09-25 ENCOUNTER — Encounter: Payer: Self-pay | Admitting: Student

## 2024-09-25 ENCOUNTER — Ambulatory Visit: Admitting: Student

## 2024-09-25 VITALS — BP 96/66 | HR 89 | Wt 147.5 lb

## 2024-09-25 DIAGNOSIS — Z3A37 37 weeks gestation of pregnancy: Secondary | ICD-10-CM

## 2024-09-25 DIAGNOSIS — Z3493 Encounter for supervision of normal pregnancy, unspecified, third trimester: Secondary | ICD-10-CM | POA: Diagnosis not present

## 2024-09-25 NOTE — Progress Notes (Signed)
" ° °  PRENATAL VISIT NOTE  Subjective:  Amber Moran is a 28 y.o. G3P2002 at [redacted]w[redacted]d being seen today for ongoing prenatal care.  She is currently monitored for the following issues for this low-risk pregnancy and has Supervision of low-risk pregnancy on their problem list.  Patient reports no complaints.  Contractions: Irritability. Vag. Bleeding: None.  Movement: Present. Denies leaking of fluid.   The following portions of the patient's history were reviewed and updated as appropriate: allergies, current medications, past family history, past medical history, past social history, past surgical history and problem list.   Objective:   Vitals:   09/25/24 1033  BP: 96/66  Pulse: 89  Weight: 147 lb 8 oz (66.9 kg)    Fetal Status:  Fetal Heart Rate (bpm): 137   Movement: Present    General: Alert, oriented and cooperative. Patient is in no acute distress.  Skin: Skin is warm and dry. No rash noted.   Cardiovascular: Normal heart rate noted  Respiratory: Normal respiratory effort, no problems with respiration noted  Abdomen: Soft, gravid, appropriate for gestational age.  Pain/Pressure: Absent     Pelvic: Cervical exam deferred        Extremities: Normal range of motion.  Edema: None  Mental Status: Normal mood and affect. Normal behavior. Normal judgment and thought content.      03/28/2024   10:57 AM 10/24/2021   12:15 PM 10/15/2021    8:36 AM  Depression screen PHQ 2/9  Decreased Interest 1 0 0  Down, Depressed, Hopeless 0 0 0  PHQ - 2 Score 1 0 0  Altered sleeping 0 0 0  Tired, decreased energy 2 0 0  Change in appetite 0 0 0  Feeling bad or failure about yourself  0 0 0  Trouble concentrating 0 0 0  Moving slowly or fidgety/restless 0 0 0  Suicidal thoughts 0 0 0  PHQ-9 Score 3  0  0      Data saved with a previous flowsheet row definition        03/28/2024   10:59 AM 10/24/2021   12:15 PM 10/15/2021    8:36 AM 10/06/2021    2:44 PM  GAD 7 : Generalized Anxiety Score   Nervous, Anxious, on Edge 0 0 0 0  Control/stop worrying 0 0 0 0  Worry too much - different things 0 0 0 0  Trouble relaxing 0 0 0 0  Restless 0 0 0 0  Easily annoyed or irritable 0 0 0 0  Afraid - awful might happen 0 0 0 0  Total GAD 7 Score 0 0 0 0  Anxiety Difficulty    Not difficult at all    Assessment and Plan:  Pregnancy: G3P2002 at [redacted]w[redacted]d 1. Encounter for supervision of low-risk pregnancy in third trimester (Primary) - Doing well, FHT WDL   2. [redacted] weeks gestation of pregnancy - GBS negative  Term labor symptoms and general obstetric precautions including but not limited to vaginal bleeding, contractions, leaking of fluid and fetal movement were reviewed in detail with the patient. Please refer to After Visit Summary for other counseling recommendations.   Return in about 1 week (around 10/02/2024) for LOB, IN-PERSON.  Future Appointments  Date Time Provider Department Center  10/02/2024 10:15 AM Ilean Norleen GAILS, MD [facility]  10/09/2024 10:55 AM Herchel, Gloris LABOR, MD [facility]  10/16/2024 10:55 AM Nicholaus Burnard HERO, MD [facility]    Nat Dauer, NP  "

## 2024-09-28 NOTE — L&D Delivery Note (Signed)
 OB/GYN Faculty Practice Delivery Note  Amber Moran is a 29 y.o. [id number] s/p SVD at [redacted]w[redacted]d. She was admitted for SOL.   ROM: 3h 15m with clear fluid GBS Status: negative Maximum Maternal Temperature: 98.4  Labor Progress: Initial exam 5.5cm. Progressed to complete with pitocin  and AROM.  Delivery Date/Time: 10/08/24 @ 1711 Delivery: Called to room and patient was complete and pushing. Head delivered LOA. No nuchal cord present. Posterior compound hand noted and delivered. Shoulder and body delivered in usual fashion after. Infant with spontaneous cry, placed on mother's abdomen, dried and stimulated. Cord clamped x 2 after 1-minute delay, and cut by FOB. Cord blood drawn. Placenta delivered spontaneously, intact, with 3-vessel cord. Fundus firm with massage and Pitocin . Labia, perineum, vagina, and cervix inspected, 2nd degree perineal laceration identified and repaired with 3.0 vicryl, excellent hemostasis and approximation noted.   Placenta: L&D Complications: None Lacerations: 2nd degree perineal laceration identified and repaired with 3.0 vicryl, excellent hemostasis and approximation noted.  EBL: 96 Analgesia: Epidural  Postpartum Planning [x ] transfer orders to MB [x ] discharge summary started & shared [x ] message to sent to schedule follow-up  [x ] lists updated  Infant: baby boy  APGARs 8/9  [id number]  Conard Me, SNM, RNC-OB Student Nurse Midwife 10/08/2024 7:34 PM

## 2024-10-02 ENCOUNTER — Other Ambulatory Visit: Payer: Self-pay

## 2024-10-02 ENCOUNTER — Ambulatory Visit (INDEPENDENT_AMBULATORY_CARE_PROVIDER_SITE_OTHER): Admitting: Family Medicine

## 2024-10-02 VITALS — BP 105/71 | HR 76 | Wt 147.0 lb

## 2024-10-02 DIAGNOSIS — Z3A38 38 weeks gestation of pregnancy: Secondary | ICD-10-CM | POA: Diagnosis not present

## 2024-10-02 DIAGNOSIS — Z3493 Encounter for supervision of normal pregnancy, unspecified, third trimester: Secondary | ICD-10-CM

## 2024-10-02 NOTE — Progress Notes (Signed)
 "  PRENATAL VISIT NOTE  Subjective:  Amber Moran is a 28 y.o. G3P2002 at [redacted]w[redacted]d being seen today for ongoing prenatal care.  She is currently monitored for the following issues for this low-risk pregnancy and has Supervision of low-risk pregnancy on their problem list.  Patient reports no bleeding, no contractions, no cramping, and no leaking.  Contractions: Irritability. Vag. Bleeding: None.  Movement: Present. Denies leaking of fluid.   The following portions of the patient's history were reviewed and updated as appropriate: allergies, current medications, past family history, past medical history, past social history, past surgical history and problem list.   Objective:   Vitals:   10/02/24 1030  BP: 105/71  Pulse: 76  Weight: 147 lb (66.7 kg)    Fetal Status:  Fetal Heart Rate (bpm): 136   Movement: Present    General: Alert, oriented and cooperative. Patient is in no acute distress.  Skin: Skin is warm and dry. No rash noted.   Cardiovascular: Normal heart rate noted  Respiratory: Normal respiratory effort, no problems with respiration noted  Abdomen: Soft, gravid, appropriate for gestational age.  Pain/Pressure: Absent     Pelvic: Cervical exam deferred        Extremities: Normal range of motion.  Edema: None  Mental Status: Normal mood and affect. Normal behavior. Normal judgment and thought content.      03/28/2024   10:57 AM 10/24/2021   12:15 PM 10/15/2021    8:36 AM  Depression screen PHQ 2/9  Decreased Interest 1 0 0  Down, Depressed, Hopeless 0 0 0  PHQ - 2 Score 1 0 0  Altered sleeping 0 0 0  Tired, decreased energy 2 0 0  Change in appetite 0 0 0  Feeling bad or failure about yourself  0 0 0  Trouble concentrating 0 0 0  Moving slowly or fidgety/restless 0 0 0  Suicidal thoughts 0 0 0  PHQ-9 Score 3  0  0      Data saved with a previous flowsheet row definition        03/28/2024   10:59 AM 10/24/2021   12:15 PM 10/15/2021    8:36 AM 10/06/2021    2:44 PM   GAD 7 : Generalized Anxiety Score  Nervous, Anxious, on Edge 0 0 0 0  Control/stop worrying 0 0 0 0  Worry too much - different things 0 0 0 0  Trouble relaxing 0 0 0 0  Restless 0 0 0 0  Easily annoyed or irritable 0 0 0 0  Afraid - awful might happen 0 0 0 0  Total GAD 7 Score 0 0 0 0  Anxiety Difficulty    Not difficult at all    Assessment and Plan:  Pregnancy: G3P2002 at [redacted]w[redacted]d 1. Encounter for supervision of low-risk pregnancy in third trimester (Primary) FHR and BP appropriate today Patient declines SVE today but would like 1 at next visit Scheduled for postdates induction  2. [redacted] weeks gestation of pregnancy    Term labor symptoms and general obstetric precautions including but not limited to vaginal bleeding, contractions, leaking of fluid and fetal movement were reviewed in detail with the patient. Please refer to After Visit Summary for other counseling recommendations.   No follow-ups on file.  Future Appointments  Date Time Provider Department Center  10/09/2024 10:55 AM Anyanwu, Gloris LABOR, MD [facility]  10/16/2024 10:55 AM Nicholaus Burnard HERO, MD [facility]  10/18/2024  7:00 AM MC-LD SCHED ROOM [facility]-INDC None  Cheyanne Lamison V Edvardo Honse, MD  "

## 2024-10-07 ENCOUNTER — Other Ambulatory Visit: Payer: Self-pay

## 2024-10-07 ENCOUNTER — Encounter (HOSPITAL_COMMUNITY): Payer: Self-pay | Admitting: Obstetrics and Gynecology

## 2024-10-07 ENCOUNTER — Inpatient Hospital Stay (HOSPITAL_COMMUNITY)
Admission: AD | Admit: 2024-10-07 | Discharge: 2024-10-07 | Disposition: A | Discharge status: Home / Self Care | Attending: Obstetrics and Gynecology | Admitting: Obstetrics and Gynecology

## 2024-10-07 DIAGNOSIS — Z3A39 39 weeks gestation of pregnancy: Secondary | ICD-10-CM | POA: Diagnosis not present

## 2024-10-07 DIAGNOSIS — Z3493 Encounter for supervision of normal pregnancy, unspecified, third trimester: Secondary | ICD-10-CM

## 2024-10-07 DIAGNOSIS — O479 False labor, unspecified: Secondary | ICD-10-CM

## 2024-10-07 NOTE — MAU Provider Note (Signed)
 S: Patient is here for RN labor evaluation. Fetal tracing, vital signs, & chart reviewed   O:  Vitals:   10/07/24 0745 10/07/24 0808 10/07/24 0811  BP: 100/69 106/70   Pulse: 90 85   Resp: 18    Temp: 97.8 F (36.6 C)    TempSrc: Oral    SpO2: 98%  97%  Weight: 67 kg    Height: 5' 2 (1.575 m)     No results found for this or any previous visit (from the past 24 hours).  Dilation: 4 Effacement (%): 80 Station: -3 Presentation: Vertex Exam by:: M. Forsberg, RNC   FHR: 130 bpm, Mod Var, - Decels, + Accels UC: 1-2x in   A: 1. False labor   2. Encounter for supervision of low-risk pregnancy in third trimester   3. [redacted] weeks gestation of pregnancy      P:  RN to discharge home in stable condition with return precautions & fetal kick counts  [employer], DO @T @ 9:20 AM

## 2024-10-07 NOTE — Discharge Instructions (Signed)
 Please go to the MAU (Maternity Assessment Unit) at [facility] Entrance C if you experience vaginal bleeding,leaking/gush of fluid like your water broke, notice decreased movement from your baby after doing kick counts, or contractions the are becoming more intense or more frequent.   Fetal Movement Counts When you're pregnant, you might start feeling your baby move around the middle of your pregnancy. At first, these movements might feel like flutters, rolls, or swishes. As your baby grows, you might feel more kicks and jabs. Around week 28 of your pregnancy, your health care team may ask you to count how often your baby moves. This is important for all pregnancies, but especially for high-risk ones. Counting movements can help lessen the risk of stillbirth.  What is a fetal movement count? A fetal movement count is the number of times that you feel your baby move during a certain amount of time. This may also be called a kick count. There are many ways to do a kick count. Ask your team what is best for you. Pay attention to when your baby is most active. You may notice your baby's sleep and wake cycles. You may also notice things that make your baby move more. When you do a kick count, try to do it: When your baby is normally most active. At the same time each day.  How do I count fetal movements? Find a quiet, comfortable area. Sit or lie down. Write down the date, the start time, and the number of movements you feel. Count kicks, flutters, swishes, rolls, and jabs. Usually, you will feel at least 10 movements within 2 hours. Stop counting after you have felt 10 movements or if you have been counting for 2 hours. Write down the stop time.  Contact a health care provider if:  You don't feel 10 movements in 2 hours. Your baby isn't moving as it usually does. Your baby isn't moving at all. If you're not able to reach your provider, go to an emergency room. This information is not  intended to replace advice given to you by your health care provider. Make sure you discuss any questions you have with your health care provider.  Document Revised: 10/08/2023 Document Reviewed: 09/30/2022 Elsevier Patient Education  2025 [employer].  Signs and Symptoms of Labor Labor is the body's natural process of moving the baby and the placenta out of the uterus. The process of labor usually starts when the baby is full-term, 37 weeks and 0 days or more.   As your body prepares for labor and the birth of your baby, you may notice the following symptoms in the weeks and days before true labor starts: Your baby dropping lower into your pelvis to get into position for birth (lightening). When this happens, you may feel more pressure on your bladder and pelvic bone and less pressure on your ribs. This may make it easier to breathe. It may also cause you to need to urinate more often and have problems with bowel movements. Having practice contractions, also called Braxton Hicks contractions or false labor. These occur at irregular (unevenly spaced) intervals that are more than 10 minutes apart. False labor contractions are common after exercise or sexual activity. They will stop if you change position, rest, or drink fluids. These contractions are usually mild and do not get stronger over time. They may feel like: A backache or back pain. Mild cramps, similar to menstrual cramps. Tightening or pressure in your abdomen. Passing a small  amount of thick, bloody mucus from your vagina. This is called normal bloody show or losing your mucus plug. This may happen more than a week before labor begins, or right before labor begins, as the opening of the cervix starts to widen (dilate). For some women, the entire mucus plug passes at once. For others, pieces of the mucus plug may gradually pass over several days.  Signs and symptoms that labor has begun Signs that you are in labor may  include: Having contractions that come at regular (evenly spaced) intervals and increase in intensity. This may feel like more intense tightening or pressure in your abdomen that moves to your back. Feeling pressure in the vaginal area. Your water breaking (called rupture of membranes). This is when the sac of fluid that surrounds your baby breaks. Fluid leaking from your vagina may be clear or blood-tinged. Labor usually starts within 24 hours of your water breaking, but it may take longer to begin. Some people may feel a sudden gush of fluid; others may notice repeatedly damp underwear.  Go to the hospital when  Your water breaks. Your labor has started: painful, regular contractions that are 5 minutes apart or less. You have a fever. You have bright red blood coming from your vagina. You do not feel your baby moving. You have a severe headache with or without vision problems. You have chest pain or shortness of breath. These symptoms may represent a serious problem that is an emergency. Do not wait to see if the symptoms will go away. Get medical help right away. Call your local emergency services (911 in the [place] Do not drive yourself to the hospital.  Summary Labor is your body's natural process of moving your baby and the placenta out of your uterus. The process of labor usually starts when your baby is full-term When labor starts, or if your water breaks, call your health care provider or nurse care line. Based on your situation, they will determine when you should go in for an exam. This information is not intended to replace advice given to you by your health care provider. Make sure you discuss any questions you have with your health care provider.  Document Revised: 01/28/2021 Document Reviewed: 01/28/2021-- adapted Elsevier Patient Education  2024 [employer].

## 2024-10-07 NOTE — MAU Note (Addendum)
 MAU Labor Evaluation RN Medical Screening Exam: RN Assessment:  Amber Moran is a 29 y.o. at [redacted]w[redacted]d here in MAU for evaluation of labor. See RN triage note.   Pain Score: 8  Pain Location: Abdomen  Cervical exam:  Dilation: 4 Effacement (%): 60 Station: -3 Presentation: Vertex Exam by:: EMERSON Stanley, RNC  Fetal Monitoring: Baseline Rate (A): 139 bpm (FHT) Variability: Moderate Accelerations: 15 x 15 Decelerations: None Contraction Frequency (min): 2-5  Medical Decision Making & Provider Communication:  This RN has communicated with: Barabara Maier, DO    Communicated with MAU provider SBAR report of labor evaluation. Also reviewed contraction pattern and that non-stress test is reactive.  Contraction Frequency (min): 2-5 has been documented with no cervical change over 1 hour not indicating active labor.  Patient denies any other complaints.  Based on this report provider has given order for discharge. A discharge order and diagnosis entered by a provider. Labor discharge precautions reviewed with patient and all questions answered. Patient verbalized understanding.   Plan of Care: Discharge home with strict labor precautions

## 2024-10-07 NOTE — MAU Note (Signed)
 Amber Moran is a 29 y.o. at [redacted]w[redacted]d here in MAU reporting: ctxs since last night that are now every 5-7 mins apart. Denies any LOF or VB. Increase in pink/mucus discharge. Reports +FM   Onset of complaint: yesterday Pain score: 8- planning epidural Vitals:   10/07/24 0745  BP: 100/69  Pulse: 90  Resp: 18  Temp: 97.8 F (36.6 C)  SpO2: 98%     FHT:133 Lab orders placed from triage:  labor eval

## 2024-10-08 ENCOUNTER — Inpatient Hospital Stay (HOSPITAL_COMMUNITY)
Admission: AD | Admit: 2024-10-08 | Discharge: 2024-10-10 | DRG: 807 | Disposition: A | Attending: Obstetrics and Gynecology | Admitting: Obstetrics and Gynecology

## 2024-10-08 ENCOUNTER — Inpatient Hospital Stay (HOSPITAL_COMMUNITY): Admitting: Anesthesiology

## 2024-10-08 ENCOUNTER — Encounter (HOSPITAL_COMMUNITY): Payer: Self-pay | Admitting: Obstetrics and Gynecology

## 2024-10-08 DIAGNOSIS — Z3A39 39 weeks gestation of pregnancy: Secondary | ICD-10-CM

## 2024-10-08 DIAGNOSIS — O26893 Other specified pregnancy related conditions, third trimester: Secondary | ICD-10-CM | POA: Diagnosis present

## 2024-10-08 DIAGNOSIS — O326XX0 Maternal care for compound presentation, not applicable or unspecified: Secondary | ICD-10-CM | POA: Diagnosis not present

## 2024-10-08 LAB — COMPREHENSIVE METABOLIC PANEL WITH GFR
ALT: 45 U/L — ABNORMAL HIGH (ref 0–44)
AST: 39 U/L (ref 15–41)
Albumin: 3.9 g/dL (ref 3.5–5.0)
Alkaline Phosphatase: 215 U/L — ABNORMAL HIGH (ref 38–126)
Anion gap: 14 (ref 5–15)
BUN: 11 mg/dL (ref 6–20)
CO2: 20 mmol/L — ABNORMAL LOW (ref 22–32)
Calcium: 9.2 mg/dL (ref 8.9–10.3)
Chloride: 102 mmol/L (ref 98–111)
Creatinine, Ser: 0.59 mg/dL (ref 0.44–1.00)
GFR, Estimated: >60 mL/min
Glucose, Bld: 97 mg/dL (ref 70–99)
Potassium: 4.1 mmol/L (ref 3.5–5.1)
Sodium: 136 mmol/L (ref 135–145)
Total Bilirubin: 1.2 mg/dL (ref 0.0–1.2)
Total Protein: 7.5 g/dL (ref 6.5–8.1)

## 2024-10-08 LAB — CBC
HCT: 41.6 % (ref 36.0–46.0)
Hemoglobin: 14 g/dL (ref 12.0–15.0)
MCH: 29.4 pg (ref 26.0–34.0)
MCHC: 33.7 g/dL (ref 30.0–36.0)
MCV: 87.4 fL (ref 80.0–100.0)
Platelets: 257 K/uL (ref 150–400)
RBC: 4.76 MIL/uL (ref 3.87–5.11)
RDW: 13.3 % (ref 11.5–15.5)
WBC: 12.8 K/uL — ABNORMAL HIGH (ref 4.0–10.5)
nRBC: 0 % (ref 0.0–0.2)

## 2024-10-08 LAB — TYPE AND SCREEN
ABO/RH(D): AB POS
Antibody Screen: NEGATIVE

## 2024-10-08 LAB — SYPHILIS: RPR W/REFLEX TO RPR TITER AND TREPONEMAL ANTIBODIES, TRADITIONAL SCREENING AND DIAGNOSIS ALGORITHM: RPR Ser Ql: NONREACTIVE

## 2024-10-08 MED ORDER — IBUPROFEN 600 MG PO TABS
600.0000 mg | ORAL_TABLET | Freq: Four times a day (QID) | ORAL | Status: DC
  Administered 2024-10-08 – 2024-10-10 (×8): 600 mg via ORAL
  Filled 2024-10-08 (×8): qty 1

## 2024-10-08 MED ORDER — FENTANYL-BUPIVACAINE-NACL 0.5-0.125-0.9 MG/250ML-% EP SOLN
12.0000 mL/h | EPIDURAL | Status: DC | PRN
  Administered 2024-10-08: 10 mL/h via EPIDURAL
  Filled 2024-10-08: qty 250

## 2024-10-08 MED ORDER — PRENATAL MULTIVITAMIN CH
1.0000 | ORAL_TABLET | Freq: Every day | ORAL | Status: DC
  Administered 2024-10-09 – 2024-10-10 (×2): 1 via ORAL
  Filled 2024-10-08 (×2): qty 1

## 2024-10-08 MED ORDER — EPHEDRINE 5 MG/ML INJ: 10.0000 mg | INTRAVENOUS | Status: DC | PRN

## 2024-10-08 MED ORDER — ZOLPIDEM TARTRATE 5 MG PO TABS: 5.0000 mg | ORAL_TABLET | Freq: Every evening | ORAL | Status: DC | PRN

## 2024-10-08 MED ORDER — OXYTOCIN-SODIUM CHLORIDE 30-0.9 UT/500ML-% IV SOLN
1.0000 m[IU]/min | INTRAVENOUS | Status: DC
  Administered 2024-10-08: 2 m[IU]/min via INTRAVENOUS

## 2024-10-08 MED ORDER — SODIUM CHLORIDE 0.9 % IV SOLN
INTRAVENOUS | Status: DC | PRN
  Administered 2024-10-08: 4.5 mL via EPIDURAL

## 2024-10-08 MED ORDER — LIDOCAINE-EPINEPHRINE (PF) 2 %-1:200000 IJ SOLN
INTRAMUSCULAR | Status: DC | PRN
  Administered 2024-10-08: 3 mL via EPIDURAL

## 2024-10-08 MED ORDER — MEASLES, MUMPS & RUBELLA VAC ~~LOC~~ SUSR: 0.5000 mL | Freq: Once | SUBCUTANEOUS | Status: DC

## 2024-10-08 MED ORDER — OXYTOCIN BOLUS FROM INFUSION
333.0000 mL | Freq: Once | INTRAVENOUS | Status: AC
  Administered 2024-10-08: 333 mL via INTRAVENOUS

## 2024-10-08 MED ORDER — OXYCODONE-ACETAMINOPHEN 5-325 MG PO TABS: 2.0000 | ORAL_TABLET | ORAL | Status: DC | PRN

## 2024-10-08 MED ORDER — FENTANYL CITRATE (PF) 100 MCG/2ML IJ SOLN: 50.0000 ug | INTRAMUSCULAR | Status: DC | PRN

## 2024-10-08 MED ORDER — DIPHENHYDRAMINE HCL 50 MG/ML IJ SOLN: 12.5000 mg | INTRAMUSCULAR | Status: DC | PRN

## 2024-10-08 MED ORDER — SENNOSIDES-DOCUSATE SODIUM 8.6-50 MG PO TABS
2.0000 | ORAL_TABLET | ORAL | Status: DC
  Administered 2024-10-08: 2 via ORAL
  Filled 2024-10-08: qty 2

## 2024-10-08 MED ORDER — ONDANSETRON HCL 4 MG PO TABS: 4.0000 mg | ORAL_TABLET | ORAL | Status: DC | PRN

## 2024-10-08 MED ORDER — OXYCODONE HCL 5 MG PO TABS: 5.0000 mg | ORAL_TABLET | ORAL | Status: DC | PRN

## 2024-10-08 MED ORDER — DIPHENHYDRAMINE HCL 25 MG PO CAPS: 25.0000 mg | ORAL_CAPSULE | Freq: Four times a day (QID) | ORAL | Status: DC | PRN

## 2024-10-08 MED ORDER — LACTATED RINGERS IV SOLN
500.0000 mL | Freq: Once | INTRAVENOUS | Status: AC
  Administered 2024-10-08: 500 mL via INTRAVENOUS

## 2024-10-08 MED ORDER — SIMETHICONE 80 MG PO CHEW: 80.0000 mg | CHEWABLE_TABLET | ORAL | Status: DC | PRN

## 2024-10-08 MED ORDER — BENZOCAINE-MENTHOL 20-0.5 % EX AERO
1.0000 | INHALATION_SPRAY | CUTANEOUS | Status: DC | PRN
  Administered 2024-10-08: 1 via TOPICAL
  Filled 2024-10-08: qty 56

## 2024-10-08 MED ORDER — DIBUCAINE (PERIANAL) 1 % EX OINT: 1.0000 | TOPICAL_OINTMENT | CUTANEOUS | Status: DC | PRN

## 2024-10-08 MED ORDER — ACETAMINOPHEN 325 MG PO TABS: 650.0000 mg | ORAL_TABLET | ORAL | Status: DC | PRN

## 2024-10-08 MED ORDER — TERBUTALINE SULFATE 1 MG/ML IJ SOLN: 0.2500 mg | Freq: Once | INTRAMUSCULAR | Status: DC | PRN

## 2024-10-08 MED ORDER — LACTATED RINGERS IV SOLN: INTRAVENOUS | Status: DC

## 2024-10-08 MED ORDER — SOD CITRATE-CITRIC ACID 500-334 MG/5ML PO SOLN: 30.0000 mL | ORAL | Status: DC | PRN

## 2024-10-08 MED ORDER — PHENYLEPHRINE 80 MCG/ML (10ML) SYRINGE FOR IV PUSH (FOR BLOOD PRESSURE SUPPORT): 80.0000 ug | PREFILLED_SYRINGE | INTRAVENOUS | Status: DC | PRN

## 2024-10-08 MED ORDER — PHENYLEPHRINE 80 MCG/ML (10ML) SYRINGE FOR IV PUSH (FOR BLOOD PRESSURE SUPPORT)
80.0000 ug | PREFILLED_SYRINGE | INTRAVENOUS | Status: DC | PRN
  Filled 2024-10-08: qty 10

## 2024-10-08 MED ORDER — TETANUS-DIPHTH-ACELL PERTUSSIS 5-2-15.5 LF-MCG/0.5 IM SUSP: 0.5000 mL | Freq: Once | INTRAMUSCULAR | Status: DC

## 2024-10-08 MED ORDER — ONDANSETRON HCL 4 MG/2ML IJ SOLN: 4.0000 mg | INTRAMUSCULAR | Status: DC | PRN

## 2024-10-08 MED ORDER — OXYCODONE-ACETAMINOPHEN 5-325 MG PO TABS: 1.0000 | ORAL_TABLET | ORAL | Status: DC | PRN

## 2024-10-08 MED ORDER — ACETAMINOPHEN 325 MG PO TABS
650.0000 mg | ORAL_TABLET | ORAL | Status: DC | PRN
  Administered 2024-10-09: 650 mg via ORAL
  Filled 2024-10-08: qty 2

## 2024-10-08 MED ORDER — ONDANSETRON HCL 4 MG/2ML IJ SOLN: 4.0000 mg | Freq: Four times a day (QID) | INTRAMUSCULAR | Status: DC | PRN

## 2024-10-08 MED ORDER — LIDOCAINE HCL (PF) 1 % IJ SOLN: 30.0000 mL | INTRAMUSCULAR | Status: DC | PRN

## 2024-10-08 MED ORDER — WITCH HAZEL-GLYCERIN EX PADS: 1.0000 | MEDICATED_PAD | CUTANEOUS | Status: DC | PRN

## 2024-10-08 MED ORDER — OXYTOCIN-SODIUM CHLORIDE 30-0.9 UT/500ML-% IV SOLN
2.5000 [IU]/h | INTRAVENOUS | Status: DC
  Filled 2024-10-08: qty 500

## 2024-10-08 MED ORDER — LACTATED RINGERS IV SOLN: 500.0000 mL | INTRAVENOUS | Status: DC | PRN

## 2024-10-08 MED ORDER — LIDOCAINE HCL (PF) 1 % IJ SOLN
INTRAMUSCULAR | Status: DC | PRN
  Administered 2024-10-08: 3 mL via SUBCUTANEOUS

## 2024-10-08 MED ORDER — COCONUT OIL OIL: 1.0000 | TOPICAL_OIL | Status: DC | PRN

## 2024-10-08 NOTE — Discharge Summary (Signed)
 Postpartum Discharge Summary    Patient Name: Amber Moran DOB: 29-Dec-1995 MRN: [id number]  Date of admission: 10/08/2024 Delivery date:10/08/2024 Delivering provider: LORELI IHA D Date of discharge: 10/10/2024  Admitting diagnosis: Indication for care or intervention related to labor and delivery [O75.9] Intrauterine pregnancy: [redacted]w[redacted]d     Secondary diagnosis:  Principal Problem:   Indication for care or intervention related to labor and delivery  Additional problems: none    Discharge diagnosis: Term Pregnancy Delivered                                              Post partum procedures:none Augmentation: AROM and Pitocin  Complications: None  Hospital course: Onset of Labor With Vaginal Delivery      29 y.o. yo [id number] at [redacted]w[redacted]d was admitted in Active Labor on 10/08/2024. Labor course was complicated by a protracted active phase/persistent cervical lip.  Membrane Rupture Time/Date: 2:05 PM,10/08/2024  Delivery Method:Vaginal, Spontaneous Operative Delivery:N/A Episiotomy: None Lacerations:  2nd degree;Perineal Patient had an uncomplicated postpartum course. She is ambulating, tolerating a regular diet, passing flatus, and urinating well. Patient is discharged home in stable condition on 10/10/2024.  Newborn Data: Birth date:10/08/2024 Birth time:5:11 PM Gender:Female Living status:Living Apgars:8 ,9  Weight:3900 g (8lb 9.6oz)  Magnesium Sulfate received: No BMZ received: No Rhophylac:N/A MMR:N/A T-DaP:Given prenatally Flu: No RSV Vaccine received: Yes Transfusion:No  Immunizations received: Immunization History  Administered Date(s) Administered    sv, Bivalent, Protein Subunit Rsvpref,pf (Abrysvo) 09/05/2024   Influenza,inj,Quad PF,6+ Mos 10/31/2021   PPD Test 09/03/2014   Tdap 07/31/2021, 07/18/2024    Physical exam  Vitals:   10/09/24 0831 10/09/24 1339 10/09/24 2325 10/10/24 0515  BP: 96/65 95/63 98/65  93/67  Pulse: 78 81 84 70  Resp: 18 16 16 16   Temp: 98 F  (36.7 C) 98 F (36.7 C) 98.2 F (36.8 C) 98 F (36.7 C)  TempSrc: Oral Oral  Oral  SpO2: 98% 98% 99% 98%   General: alert, cooperative, and no distress Lochia: appropriate Uterine Fundus: firm DVT Evaluation: No evidence of DVT seen on physical exam. Labs: Lab Results  Component Value Date   WBC 12.8 (H) 10/08/2024   HGB 14.0 10/08/2024   HCT 41.6 10/08/2024   MCV 87.4 10/08/2024   PLT 257 10/08/2024      Latest Ref Rng & Units 10/08/2024    5:43 AM  CMP  Glucose 70 - 99 mg/dL 97   BUN 6 - 20 mg/dL 11   Creatinine 9.55 - 1.00 mg/dL 9.40   Sodium 864 - 854 mmol/L 136   Potassium 3.5 - 5.1 mmol/L 4.1   Chloride 98 - 111 mmol/L 102   CO2 22 - 32 mmol/L 20   Calcium 8.9 - 10.3 mg/dL 9.2   Total Protein 6.5 - 8.1 g/dL 7.5   Total Bilirubin 0.0 - 1.2 mg/dL 1.2   Alkaline Phos 38 - 126 U/L 215   AST 15 - 41 U/L 39   ALT 0 - 44 U/L 45    Edinburgh Score:    10/09/2024    3:29 PM  Edinburgh Postnatal Depression Scale Screening Tool  I have been able to laugh and see the funny side of things. 0  I have looked forward with enjoyment to things. 0  I have blamed myself unnecessarily when things went wrong. 1  I have been anxious or worried for  no good reason. 1  I have felt scared or panicky for no good reason. 0  Things have been getting on top of me. 1  I have been so unhappy that I have had difficulty sleeping. 0  I have felt sad or miserable. 0  I have been so unhappy that I have been crying. 0  The thought of harming myself has occurred to me. 0  Edinburgh Postnatal Depression Scale Total 3   Edinburgh Postnatal Depression Scale Total: 3   After visit meds:  Allergies as of 10/10/2024   No Known Allergies      Medication List     TAKE these medications    benzocaine -Menthol  20-0.5 % Aero Commonly known as: DERMOPLAST Apply 1 Application topically as needed for irritation (perineal discomfort).   coconut oil Oil Apply 1 Application topically as needed.    ibuprofen  600 MG tablet Commonly known as: ADVIL  Take 1 tablet (600 mg total) by mouth every 6 (six) hours as needed.   Prenatal Plus Vitamin/Mineral 27-1 MG Tabs Take 1 tablet by mouth daily.   senna-docusate 8.6-50 MG tablet Commonly known as: Senokot-S Take 2 tablets by mouth daily.   witch hazel-glycerin  pad Commonly known as: TUCKS Apply 1 Application topically as needed for hemorrhoids.         Discharge home in stable condition Infant Feeding: breastfeeding and bottlefeeding  Infant Disposition:home with mother Discharge instruction: per After Visit Summary and Postpartum booklet. Activity: Advance as tolerated. Pelvic rest for 6 weeks.  Diet: routine diet Future Appointments: Future Appointments  Date Time Provider Department Center  11/22/2024  2:55 PM Cleatus Moccasin, MD [facility]  11/23/2024  8:55 AM Letha Renshaw, CNM [facility] None   Follow up Visit:  Loreli Suzen BIRCH, CNM  P [facility] Admin Pool Please schedule this patient for Postpartum visit in: 6 weeks with the following provider: Any provider In-Person For C/S patients schedule nurse incision check in weeks 2 weeks: no Low risk pregnancy complicated by: none Delivery mode:  SVD Anticipated Birth Control: Nexplanon at 6wk postpartum visit PP Procedures needed: routine Pap Schedule Integrated BH visit: no   10/10/2024 Jazmine Liboon, CNM

## 2024-10-08 NOTE — H&P (Signed)
 OBSTETRIC ADMISSION HISTORY AND PHYSICAL  Amber Moran is a 29 y.o. female G48P2002 with IUP at [redacted]w[redacted]d (dated by LMP, Estimated Date of Delivery: 10/11/24) presenting for SOL.   She reports +FMs, No LOF, no VB, no blurry vision, headaches or peripheral edema, and RUQ pain.    She plans on breast feeding. She request nexplanon vs POPs for birth control.  She received her [facility] at [facility]   Prenatal History/Complications: N/A  Past Medical History: Past Medical History:  Diagnosis Date   Medical history non-contributory     Past Surgical History: Past Surgical History:  Procedure Laterality Date   NO PAST SURGERIES      Obstetrical History: OB History     Gravida  3   Para  2   Term  2   Preterm  0   AB  0   Living  2      SAB  0   IAB  0   Ectopic  0   Multiple  0   Live Births  2           Social History Social History   Socioeconomic History   Marital status: Married    Spouse name: Not on file   Number of children: Not on file   Years of education: Not on file   Highest education level: Not on file  Occupational History   Not on file  Tobacco Use   Smoking status: Never    Passive exposure: Never   Smokeless tobacco: Never  Vaping Use   Vaping status: Never Used  Substance and Sexual Activity   Alcohol use: Not Currently   Drug use: No   Sexual activity: Yes    Birth control/protection: None  Other Topics Concern   Not on file  Social History Narrative   Not on file   Social Drivers of Health   Tobacco Use: Low Risk (10/08/2024)   Patient History    Smoking Tobacco Use: Never    Smokeless Tobacco Use: Never    Passive Exposure: Never  Financial Resource Strain: Not on file  Food Insecurity: No Food Insecurity (10/08/2024)   Epic    Worried About Programme Researcher, Broadcasting/film/video in the Last Year: Never true    Ran Out of Food in the Last Year: Never true  Transportation Needs: No Transportation Needs (10/08/2024)   Epic    Lack of  Transportation (Medical): No    Lack of Transportation (Non-Medical): No  Physical Activity: Not on file  Stress: Not on file  Social Connections: Not on file  Depression (PHQ2-9): Low Risk (03/28/2024)   Depression (PHQ2-9)    PHQ-2 Score: 3  Alcohol Screen: Not on file  Housing: Unknown (10/08/2024)   Epic    Unable to Pay for Housing in the Last Year: No    Number of Times Moved in the Last Year: Not on file    Homeless in the Last Year: No  Utilities: Not At Risk (10/08/2024)   Epic    Threatened with loss of utilities: No  Health Literacy: Not on file    Family History: Family History  Problem Relation Age of Onset   Healthy Mother    Bladder Cancer Father     Allergies: Allergies[1]  Medications Prior to Admission  Medication Sig Dispense Refill Last Dose/Taking   Prenatal Vit-Fe Fumarate-FA (PRENATAL PLUS VITAMIN/MINERAL) 27-1 MG TABS Take 1 tablet by mouth daily. 30 tablet 11 Past Week     Review  of Systems  All systems reviewed and negative except as stated in HPI.  Blood pressure 99/66, pulse 89, temperature 98.4 F (36.9 C), temperature source Oral, resp. rate 16, last menstrual period 01/05/2024, SpO2 98%. General appearance: alert, cooperative, and appears stated age Lungs: breathing comfortably on room air Heart: regular rate Abdomen: soft, non-tender; gravid Extremities: no edema of bilateral lower extremities DTR's intact Presentation: cephalic Fetal monitoringBaseline: 150 bpm, Variability: Good {> 6 bpm), Accelerations: Reactive, and Decelerations: Absent Uterine activityFrequency: Every 3 minutes  Dilation: 8 Effacement (%): 90 Station: -2 Exam by:: Arlean Pizza, RN   Prenatal labs: ABO, Rh: --/--/AB POS (01/11 0543) Antibody: NEG (01/11 0543) Rubella: 10.40 (07/01 1000) RPR: Non Reactive (10/22 0924)  HBsAg: Negative (07/01 1000)  HIV: Non Reactive (10/22 0924)  GBS: Negative/-- (12/23 1318)  2 hr Glucola normal Genetic screening   Declined Anatomy US  Appears Normal Last US : At [redacted]w[redacted]d - cephalic presentation, EFW 331g (67 %tile), AC 74%  Prenatal Transfer Tool  Maternal Diabetes: No Genetic Screening: Declined Maternal Ultrasounds/Referrals: Normal Fetal Ultrasounds or other Referrals:  None Maternal Substance Abuse:  No Significant Maternal Medications:  None Significant Maternal Lab Results:  Group B Strep negative Number of Prenatal Visits:greater than 3 verified prenatal visits Other Comments:  None  Results for orders placed or performed during the hospital encounter of 10/08/24 (from the past 24 hours)  CBC   Collection Time: 10/08/24  5:43 AM  Result Value Ref Range   WBC 12.8 (H) 4.0 - 10.5 K/uL   RBC 4.76 3.87 - 5.11 MIL/uL   Hemoglobin 14.0 12.0 - 15.0 g/dL   HCT 58.3 63.9 - 53.9 %   MCV 87.4 80.0 - 100.0 fL   MCH 29.4 26.0 - 34.0 pg   MCHC 33.7 30.0 - 36.0 g/dL   RDW 86.6 88.4 - 84.4 %   Platelets 257 150 - 400 K/uL   nRBC 0.0 0.0 - 0.2 %  Comprehensive metabolic panel   Collection Time: 10/08/24  5:43 AM  Result Value Ref Range   Sodium 136 135 - 145 mmol/L   Potassium 4.1 3.5 - 5.1 mmol/L   Chloride 102 98 - 111 mmol/L   CO2 20 (L) 22 - 32 mmol/L   Glucose, Bld 97 70 - 99 mg/dL   BUN 11 6 - 20 mg/dL   Creatinine, Ser 9.40 0.44 - 1.00 mg/dL   Calcium 9.2 8.9 - 89.6 mg/dL   Total Protein 7.5 6.5 - 8.1 g/dL   Albumin 3.9 3.5 - 5.0 g/dL   AST 39 15 - 41 U/L   ALT 45 (H) 0 - 44 U/L   Alkaline Phosphatase 215 (H) 38 - 126 U/L   Total Bilirubin 1.2 0.0 - 1.2 mg/dL   GFR, Estimated >39 >39 mL/min   Anion gap 14 5 - 15  Type and screen [facility]   Collection Time: 10/08/24  5:43 AM  Result Value Ref Range   ABO/RH(D) AB POS    Antibody Screen NEG    Sample Expiration      10/11/2024,2359 Performed at [facility], [address], [place], [place] [address]     Patient Active Problem List   Diagnosis Date Noted   Indication for care or intervention related  to labor and delivery 10/08/2024   Supervision of low-risk pregnancy 03/21/2024    Assessment/Plan:  Amber Moran is a 29 y.o. G3P2002 at [redacted]w[redacted]d here for SOL  #Labor: Contracting regularly. Expectant management #Pain: Epidural  in place #FWB: Cat I #ID:  GBS (-) #MOF: Breast #MOC: Nexplanon outpatient  #Circ:  No  Barkley Angles, MD OB Fellow, Faculty Practice [place], Center for [facility] 10/08/2024 7:10 AM        [1] No Known Allergies

## 2024-10-08 NOTE — Progress Notes (Signed)
 Labor Progress Note Amber Moran is a 29 y.o. H6E7997 at [redacted]w[redacted]d presented for SOL  S:  Beginning to feel pressure intermittently.  O:  BP 95/67   Pulse 82   Temp 98.3 F (36.8 C) (Oral)   Resp 16   LMP 01/05/2024 (Exact Date)   SpO2 98%   EFM: baseline 135 bpm/ moderate variability/ 15x15 accels/ no decels  Toco/IUPC: q 2-42mins  SVE: Dilation: 8.5 Effacement (%): 90 Station: -1 Presentation: Vertex Exam by:: Amber Moran, SNM Pitocin : 6 mu/min  A/P: 29 y.o. G3P2002 [redacted]w[redacted]d  1. Labor: Discussed RBA of AROM to patient. Patient agreeable. Clear fluid returned. 2. FWB: Cat 1 3. Pain: epidural 4. GBS neg   Anticipate SVB.  Amber Moran, Student-MidWife 2:11 PM

## 2024-10-08 NOTE — Progress Notes (Signed)
 Faculty Note  S: In to meet patient.   O: BP 91/61   Pulse 83   Temp 98.4 F (36.9 C) (Oral)   Resp 16   LMP 01/05/2024 (Exact Date)   SpO2 98%   Gen: alert, oriented SVE: deferred  FHT: 140 bpm, moderate variability, accels present, no decels Toco: ctx every 1-3 min   A/P: Pt is 29 y.o. H6E7997 @ [redacted]w[redacted]d who is spontaneous labor. Doing well and progressing. Will augment as needed, comfortable with epidural.   K. Yolanda Moats, MD, [facility] Attending Center for [facility])

## 2024-10-08 NOTE — Lactation Note (Signed)
 This note was copied from a baby's chart. Lactation Consultation Note  Patient Name: Amber Moran Date: 10/08/2024 Age:31 hours Reason for consult: Initial assessment;Term  P3- MOB's feeding plan is to offer both breast milk and formula. MOB reports that infant has both latched and taken formula since being born. MOB feels like infant does well with both. MOB's reasoning for feeding both is because of a hx of low supply with her second child. LC encouraged MOB to always offer the breast first, then supplement with formula after as needed. LC also encouraged MOB to call for a latch assessment.  LC reviewed the first 24 hr birthday nap, day 2 cluster feeding, feeding infant on cue 8-12x in 24 hrs, not allowing infant to go over 3 hrs without a feeding, CDC milk storage guidelines, LC services handout and engorgement/breast care. LC encouraged MOB to call for further assistance as needed.  Maternal Data Has patient been taught Hand Expression?: No Does the patient have breastfeeding experience prior to this delivery?: Yes How long did the patient breastfeed?: first child formula only, second child both breast milk and formula for 6 months (reports having a low supply)  Feeding Mother's Current Feeding Choice: Breast Milk and Formula  Lactation Tools Discussed/Used Pump Education: Milk Storage  Interventions Interventions: Breast feeding basics reviewed;Education;LC Services brochure  Discharge Discharge Education: Engorgement and breast care;Warning signs for feeding baby Pump: Advised to call insurance company  Consult Status Consult Status: Follow-up Date: 10/09/24 Follow-up type: In-patient    Recardo Hoit BS, IBCLC 10/08/2024, 9:03 PM

## 2024-10-08 NOTE — MAU Note (Signed)
 MAU Labor Triage Note:  .Amber Moran is a 29 y.o. at [redacted]w[redacted]d here in MAU reporting:  Contractions every: 5 minutes Onset of ctx: on-going Pain Score: 9  Pain Location: Abdomen  ROM: denies Vaginal Bleeding: denies Last SVE: 1/10 AM was 4/60/-3 Labor Pain Management Plan: Planning epidural  GBS: Negative  Fetal Movement: Reports positive FM FHT: Fetal Heart Rate Mode: Doppler Baseline Rate (A): 140 bpm Multiple birth?: No  Vitals:   10/08/24 0515  Pulse: 94  Resp: 18  Temp: 97.9 F (36.6 C)  SpO2: 100%      Lab orders placed from triage: MAU Labor Eval OB Office: Faculty

## 2024-10-08 NOTE — Anesthesia Preprocedure Evaluation (Signed)
 Anesthesia Evaluation  Patient identified by MRN, date of birth, ID band Patient awake    Reviewed: Allergy & Precautions, NPO status , Patient's Chart, lab work & pertinent test results  History of Anesthesia Complications Negative for: history of anesthetic complications  Airway Mallampati: II  TM Distance: >3 FB Neck ROM: Full    Dental no notable dental hx. (+) Teeth Intact   Pulmonary neg pulmonary ROS, neg sleep apnea, neg COPD, Patient abstained from smoking.Not current smoker   Pulmonary exam normal breath sounds clear to auscultation       Cardiovascular Exercise Tolerance: Good METS(-) hypertension(-) CAD and (-) Past MI negative cardio ROS (-) dysrhythmias  Rhythm:Regular Rate:Normal - Systolic murmurs    Neuro/Psych negative neurological ROS  negative psych ROS   GI/Hepatic ,neg GERD  ,,(+)     (-) substance abuse    Endo/Other  neg diabetes    Renal/GU negative Renal ROS     Musculoskeletal   Abdominal   Peds  Hematology Denies blood thinner use or bleeding disorders.    Anesthesia Other Findings Denies blood thinner use or bleeding diatheses. Recent labs reviewed. Past Medical History: No date: Medical history non-contributory   Reproductive/Obstetrics (+) Pregnancy                              Anesthesia Physical Anesthesia Plan  ASA: 2  Anesthesia Plan: Epidural   Post-op Pain Management:    Induction:   PONV Risk Score and Plan: 2 and Treatment may vary due to age or medical condition and Ondansetron   Airway Management Planned: Natural Airway  Additional Equipment:   Intra-op Plan:   Post-operative Plan:   Informed Consent: I have reviewed the patients History and Physical, chart, labs and discussed the procedure including the risks, benefits and alternatives for the proposed anesthesia with the patient or authorized representative who has indicated  his/her understanding and acceptance.       Plan Discussed with: Surgeon  Anesthesia Plan Comments: (Discussed R/B/A of neuraxial anesthesia technique with patient: - rare risks of spinal/epidural hematoma, nerve damage, infection - Risk of PDPH - Risk of itching - Risk of nausea and vomiting - Risk of poor block necessitating replacement of epidural. - Risk of allergic reactions. Patient voiced understanding.)        Anesthesia Quick Evaluation

## 2024-10-08 NOTE — Progress Notes (Signed)
 Labor Progress Note [name] is a 29 y.o. G3P2002 at [redacted]w[redacted]d presented for SOL  S:  Comfortable with epidural  O:  BP 98/68   Pulse 86   Temp 98.3 F (36.8 C) (Oral)   Resp 15   LMP 01/05/2024 (Exact Date)   SpO2 98%   EFM: baseline 135 bpm/ moderate variability/ 15x15 accels/ no decels  Toco/IUPC: q 2-4mins  SVE: Dilation: 8 Effacement (%): 80, 90 Station: -2 Presentation: Vertex (by bedside US ) Exam by:: Breia Ocampo, SNM   A/P: 28 y.o. H6E7997 [redacted]w[redacted]d  1. Labor: Unable to AROM due to fetal station. Consider AROM/pit at next cervical exam. 2. FWB: Cat 1 3. Pain: Epidural 4. GBS neg   Anticipate SVB.  Cammy Sanjurjo VEAR Me, Student-MidWife 9:53 AM

## 2024-10-08 NOTE — Anesthesia Procedure Notes (Signed)
 Epidural Patient location during procedure: OB Start time: 10/08/2024 6:21 AM End time: 10/08/2024 6:24 AM  Staffing Anesthesiologist: Boone Fess, MD Performed: anesthesiologist   Preanesthetic Checklist Completed: patient identified, IV checked, site marked, risks and benefits discussed, surgical consent, monitors and equipment checked, pre-op evaluation and timeout performed  Epidural Patient position: sitting Prep: ChloraPrep Patient monitoring: heart rate, continuous pulse ox and blood pressure Approach: midline Location: L4-L5 Injection technique: LOR saline  Needle:  Needle type: Tuohy  Needle gauge: 17 G Needle length: 9 cm Needle insertion depth: 4 cm Catheter type: closed end flexible Catheter size: 19 Gauge Catheter at skin depth: 9 cm Test dose: negative and 1.5% lidocaine  with Epi 1:200 K  Assessment Sensory level: T10 Events: blood not aspirated, no cerebrospinal fluid, injection not painful, no injection resistance, no paresthesia and negative IV test  Additional Notes First/one attempt Pt. Evaluated and documentation done after procedure finished. Patient identified. Risks/Benefits/Options discussed with patient including but not limited to bleeding, infection, nerve damage, paralysis, failed block, incomplete pain control, headache, blood pressure changes, nausea, vomiting, reactions to medication both or allergic, itching and postpartum back pain. Confirmed with bedside nurse the patient's most recent platelet count. Confirmed with patient that they are not currently taking any anticoagulation, have any bleeding history or any family history of bleeding disorders. Patient expressed understanding and wished to proceed. All questions were answered. Sterile technique was used throughout the entire procedure. Please see nursing notes for vital signs. Test dose was given through epidural catheter and negative prior to continuing to dose epidural or start infusion.  Warning signs of high block given to the patient including shortness of breath, tingling/numbness in hands, complete motor block, or any concerning symptoms with instructions to call for help. Patient was given instructions on fall risk and not to get out of bed. All questions and concerns addressed with instructions to call with any issues or inadequate analgesia.     Patient tolerated the insertion well without immediate complications.  Reason for block: procedure for painReason for block:procedure for pain

## 2024-10-08 NOTE — Progress Notes (Signed)
 Labor Progress Note Amber Moran is a 29 y.o. H6E7997 at [redacted]w[redacted]d presented for SOL  S:  Coping well.  O:  BP 114/74   Pulse (!) 105   Temp 98.2 F (36.8 C) (Oral)   Resp 16   LMP 01/05/2024 (Exact Date)   SpO2 98%   EFM: baseline 135 bpm/ moderate variability/ 15x15 accels/ early decels  Toco/IUPC: q 2-71mins  SVE: Dilation: 9 Effacement (%): 90 Station: 0, -1 Presentation: Vertex Exam by:: Vollie Brunty, CNM Pitocin : 6 mu/min  A/P: 29 y.o. H6E7997 [redacted]w[redacted]d  1. Labor: Patient feeling a lot of pressure. Unable to reduce remaining cervix. Repositioned on peanut ball and given epidural pca. Recheck in 30 minutes to an hour. 2. FWB: Cat 2  3. Pain: epidural 4. GBS neg   Anticipate SVB.  Jacqualin Shirkey H Eugene Zeiders, Student-MidWife 3:20 PM

## 2024-10-09 ENCOUNTER — Encounter: Admitting: Obstetrics & Gynecology

## 2024-10-09 NOTE — Anesthesia Postprocedure Evaluation (Signed)
"   Anesthesia Post Note  Patient: Amalea Selph  Procedure(s) Performed: AN AD HOC LABOR EPIDURAL     Patient location during evaluation: Mother Baby Anesthesia Type: Epidural Level of consciousness: awake and alert Pain management: pain level controlled Vital Signs Assessment: post-procedure vital signs reviewed and stable Respiratory status: spontaneous breathing, nonlabored ventilation and respiratory function stable Cardiovascular status: stable Postop Assessment: no headache, no backache and epidural receding Anesthetic complications: no   No notable events documented.  Last Vitals:  Vitals:   10/08/24 2345 10/09/24 0420  BP: (!) 87/54 111/80  Pulse: 98 89  Resp: 16 16  Temp: 37.1 C 36.9 C  SpO2: 98% 99%    Last Pain:  Vitals:   10/09/24 0430  TempSrc:   PainSc: 6    Pain Goal:                   Murial Beam      "

## 2024-10-09 NOTE — Progress Notes (Addendum)
 POSTPARTUM PROGRESS NOTE  Post Partum Day 1  Subjective:  Amber Moran is a 28 y.o. H6E6996 s/p SVD at [redacted]w[redacted]d.  No acute events overnight.  Pt denies problems with ambulating, voiding or po intake.  She denies nausea or vomiting.  Pain is well controlled.  She has had flatus. She has had bowel movement.  Lochia Minimal.   Objective: Blood pressure 111/80, pulse 89, temperature 98.5 F (36.9 C), temperature source Oral, resp. rate 16, last menstrual period 01/05/2024, SpO2 99%, unknown if currently breastfeeding.  Physical Exam:  General: alert, cooperative and no distress Chest: no respiratory distress Heart:regular rate, distal pulses intact Abdomen: soft, nontender,  Uterine Fundus: firm, appropriately tender DVT Evaluation: No calf swelling or tenderness Extremities: no edema Skin: warm, dry  Recent Labs    10/08/24 0543  HGB 14.0  HCT 41.6    Assessment/Plan: Amber Moran is a 29 y.o. H6E6996 s/p SVD at [redacted]w[redacted]d   PPD#1 - Doing well, delivered 12h ago and anticipate uncomplicated discharge 1/13AM.  Contraception: unsure Feeding: breast Dispo: Plan for discharge tomorrow. Complaining of mild back pain. Discussed possibly etiologies. Recommended continued ambulation, use of pain control agents as well as warm compresses.    LOS: 1 day   Marija Calamari, Barkley CROME, MD, CNM 10/09/2024, 6:46 AM

## 2024-10-09 NOTE — Patient Instructions (Signed)

## 2024-10-10 ENCOUNTER — Other Ambulatory Visit (HOSPITAL_COMMUNITY): Payer: Self-pay

## 2024-10-10 LAB — BIRTH TISSUE RECOVERY COLLECTION (PLACENTA DONATION)

## 2024-10-10 MED ORDER — BENZOCAINE-MENTHOL 20-0.5 % EX AERO: 1.0000 | INHALATION_SPRAY | CUTANEOUS | Status: AC | PRN

## 2024-10-10 MED ORDER — WITCH HAZEL-GLYCERIN EX PADS: 1.0000 | MEDICATED_PAD | CUTANEOUS | Status: AC | PRN

## 2024-10-10 MED ORDER — COCONUT OIL OIL: 1.0000 | TOPICAL_OIL | Status: AC | PRN

## 2024-10-10 MED ORDER — IBUPROFEN 600 MG PO TABS
600.0000 mg | ORAL_TABLET | Freq: Four times a day (QID) | ORAL | 0 refills | Status: AC | PRN
  Filled 2024-10-10: qty 120, 30d supply, fill #0

## 2024-10-10 MED ORDER — SENNOSIDES-DOCUSATE SODIUM 8.6-50 MG PO TABS: 2.0000 | ORAL_TABLET | ORAL | Status: AC

## 2024-10-16 ENCOUNTER — Encounter: Admitting: Obstetrics and Gynecology

## 2024-10-18 ENCOUNTER — Inpatient Hospital Stay (HOSPITAL_COMMUNITY)

## 2024-10-18 ENCOUNTER — Inpatient Hospital Stay (HOSPITAL_COMMUNITY): Admission: RE | Admit: 2024-10-18 | Source: Home / Self Care | Admitting: Family Medicine

## 2024-10-19 ENCOUNTER — Telehealth (HOSPITAL_COMMUNITY): Payer: Self-pay | Admitting: *Deleted

## 2024-10-19 NOTE — Telephone Encounter (Signed)
 10/19/2024  Name: Amber Moran MRN: [id number] DOB: 08-Jul-1996  Reason for Call:  Transition of Care Hospital Discharge Call  Contact Status: Patient Contact Status: Message  Language assistant needed:          Follow-Up Questions:    Van Postnatal Depression Scale:  In the Past 7 Days:    PHQ2-9 Depression Scale:     Discharge Follow-up:    Post-discharge interventions: NA  Mliss Sieve, RN 10/19/2024 11:07

## 2024-11-01 ENCOUNTER — Ambulatory Visit
Admission: RE | Admit: 2024-11-01 | Discharge: 2024-11-01 | Disposition: A | Discharge status: Home / Self Care | Source: Ambulatory Visit

## 2024-11-01 VITALS — BP 106/73 | HR 120 | Temp 100.0°F | Resp 18

## 2024-11-01 DIAGNOSIS — N3001 Acute cystitis with hematuria: Secondary | ICD-10-CM | POA: Diagnosis not present

## 2024-11-01 LAB — POCT URINE DIPSTICK
Bilirubin, UA: NEGATIVE
Glucose, UA: NEGATIVE mg/dL
Ketones, POC UA: NEGATIVE mg/dL
Nitrite, UA: NEGATIVE
POC PROTEIN,UA: 100 — AB
Spec Grav, UA: 1.02
Urobilinogen, UA: 4 U/dL — AB
pH, UA: 6

## 2024-11-01 LAB — POC COVID19/FLU A&B COMBO
Covid Antigen, POC: NEGATIVE
Influenza A Antigen, POC: NEGATIVE
Influenza B Antigen, POC: NEGATIVE

## 2024-11-01 MED ORDER — ONDANSETRON 4 MG PO TBDP: 4.0000 mg | ORAL_TABLET | Freq: Three times a day (TID) | ORAL | 0 refills | Status: AC | PRN

## 2024-11-01 MED ORDER — CEFUROXIME AXETIL 250 MG PO TABS: 250.0000 mg | ORAL_TABLET | Freq: Two times a day (BID) | ORAL | 0 refills | Status: AC

## 2024-11-01 NOTE — ED Provider Notes (Signed)
 " [facility]  Note:  This document was prepared using Dragon voice recognition software and may include unintentional dictation errors.  MRN: [id number] DOB: [birth year]  Subjective:   Amber Moran is a 29 y.o. female presenting for chills, upper abdominal pain, intermittent dysuria x 3 days.  Patient reports that she had a baby about 3 weeks ago but denies any complications or difficulties during birth.  Patient denies fever, shortness of breath, chest pain, weakness, dizziness, fatigue.  Patient has been taking ibuprofen  with minimal improvement of symptoms.  Patient does admit to having some nausea but no vomiting or diarrhea.  Current Medications[1]   Allergies[2]  Past Medical History:  Diagnosis Date   Medical history non-contributory      Past Surgical History:  Procedure Laterality Date   NO PAST SURGERIES      Family History  Problem Relation Age of Onset   Healthy Mother    Bladder Cancer Father     Social History[3]  ROS Refer to HPI for ROS details.  Objective:   Vitals: BP 106/73 (BP Location: Left Arm)   Pulse (!) 120   Temp 100 F (37.8 C) (Oral)   Resp 18   LMP 01/05/2024 (Exact Date)   SpO2 99%   Breastfeeding Yes   Physical Exam Vitals and nursing note reviewed.  Constitutional:      General: She is not in acute distress.    Appearance: Normal appearance. She is well-developed. She is not ill-appearing or toxic-appearing.  HENT:     Head: Normocephalic and atraumatic.  Cardiovascular:     Rate and Rhythm: Normal rate.  Pulmonary:     Effort: Pulmonary effort is normal. No respiratory distress.     Breath sounds: No stridor. No wheezing.  Abdominal:     General: Bowel sounds are normal. There is no distension.     Palpations: Abdomen is soft.     Tenderness: There is abdominal tenderness (Periumbilical/epigastric abdominal tenderness.). There is no right CVA tenderness or left CVA tenderness.  Skin:    General: Skin is warm and  dry.  Neurological:     General: No focal deficit present.     Mental Status: She is alert and oriented to person, place, and time.  Psychiatric:        Mood and Affect: Mood normal.        Behavior: Behavior normal.     Procedures  Results for orders placed or performed during the hospital encounter of 11/01/24 (from the past 24 hours)  POCT URINE DIPSTICK     Status: Abnormal   Collection Time: 11/01/24 12:15 PM  Result Value Ref Range   Color, UA yellow yellow   Clarity, UA cloudy (A) clear   Glucose, UA negative negative mg/dL   Bilirubin, UA negative negative   Ketones, POC UA negative negative mg/dL   Spec Grav, UA 8.979 8.989 - 1.025   Blood, UA small (A) negative   pH, UA 6.0 5.0 - 8.0   POC PROTEIN,UA =100 (A) negative, trace   Urobilinogen, UA 4.0 (A) 0.2 or 1.0 E.U./dL   Nitrite, UA Negative Negative   Leukocytes, UA Small (1+) (A) Negative  POC Covid19/Flu A&B Antigen     Status: Normal   Collection Time: 11/01/24 12:15 PM  Result Value Ref Range   Influenza A Antigen, POC Negative Negative   Influenza B Antigen, POC Negative Negative   Covid Antigen, POC Negative Negative    No results found.   Assessment  and Plan :     Discharge Instructions       1. Acute cystitis with hematuria (Primary) - POCT URINE DIPSTICK small leukocytes, small blood, no nitrite, these findings are possibly indicative of urinary infection.  Empiric treatment with antibiotic therapy will be initiated until culture results are obtained. - POC Covid19/Flu A&B Antigen complete in UC is negative for COVID and influenza - Urine Culture collected in UC due to presence of leukocytes and blood to evaluate for bacterial causes of infection.  Results should be available in 1 to 2 days. - ondansetron  (ZOFRAN -ODT) 4 MG disintegrating tablet; Take 1 tablet (4 mg total) by mouth every 8 (eight) hours as needed for nausea or vomiting.  Dispense: 20 tablet; Refill: 0 - cefUROXime  (CEFTIN ) 250 MG  tablet; Take 1 tablet (250 mg total) by mouth 2 (two) times daily with a meal for 7 days.  Dispense: 14 tablet; Refill: 0 - Follow-up with OB/GYN if symptoms continue without further progression for evaluation and treatment.  -Continue to monitor symptoms for any change in severity if there is any escalation of current symptoms or development of new symptoms follow-up in ER for further evaluation and management.       Jamonta Goerner B Reneta Niehaus    [1] No current facility-administered medications for this encounter.  Current Outpatient Medications:    cefUROXime  (CEFTIN ) 250 MG tablet, Take 1 tablet (250 mg total) by mouth 2 (two) times daily with a meal for 7 days., Disp: 14 tablet, Rfl: 0   ondansetron  (ZOFRAN -ODT) 4 MG disintegrating tablet, Take 1 tablet (4 mg total) by mouth every 8 (eight) hours as needed for nausea or vomiting., Disp: 20 tablet, Rfl: 0   benzocaine -Menthol  (DERMOPLAST) 20-0.5 % AERO, Apply 1 Application topically as needed for irritation (perineal discomfort)., Disp: , Rfl:    coconut oil OIL, Apply 1 Application topically as needed., Disp: , Rfl:    ibuprofen  (ADVIL ) 600 MG tablet, Take 1 tablet (600 mg total) by mouth every 6 (six) hours as needed., Disp: 120 tablet, Rfl: 0   Prenatal Vit-Fe Fumarate-FA (PRENATAL PLUS VITAMIN/MINERAL) 27-1 MG TABS, Take 1 tablet by mouth daily., Disp: 30 tablet, Rfl: 11   senna-docusate (SENOKOT-S) 8.6-50 MG tablet, Take 2 tablets by mouth daily., Disp: , Rfl:    witch hazel-glycerin  (TUCKS) pad, Apply 1 Application topically as needed for hemorrhoids., Disp: , Rfl:  [2] No Known Allergies [3]  Social History Tobacco Use   Smoking status: Never    Passive exposure: Never   Smokeless tobacco: Never  Vaping Use   Vaping status: Never Used  Substance Use Topics   Alcohol use: Not Currently   Drug use: No     Aurea Goodell B, NP 11/01/24 1247  "

## 2024-11-01 NOTE — Discharge Instructions (Signed)
" °  1. Acute cystitis with hematuria (Primary) - POCT URINE DIPSTICK small leukocytes, small blood, no nitrite, these findings are possibly indicative of urinary infection.  Empiric treatment with antibiotic therapy will be initiated until culture results are obtained. - POC Covid19/Flu A&B Antigen complete in UC is negative for COVID and influenza - Urine Culture collected in UC due to presence of leukocytes and blood to evaluate for bacterial causes of infection.  Results should be available in 1 to 2 days. - ondansetron  (ZOFRAN -ODT) 4 MG disintegrating tablet; Take 1 tablet (4 mg total) by mouth every 8 (eight) hours as needed for nausea or vomiting.  Dispense: 20 tablet; Refill: 0 - cefUROXime  (CEFTIN ) 250 MG tablet; Take 1 tablet (250 mg total) by mouth 2 (two) times daily with a meal for 7 days.  Dispense: 14 tablet; Refill: 0 - Follow-up with OB/GYN if symptoms continue without further progression for evaluation and treatment.  -Continue to monitor symptoms for any change in severity if there is any escalation of current symptoms or development of new symptoms follow-up in ER for further evaluation and management. "

## 2024-11-01 NOTE — ED Triage Notes (Addendum)
 Patient present for chills, and upper abdominal pain x 3 days.  Patient denies fever.  Patient had a baby about 3 weeks ago.    Patient has been taken ibphofen.,

## 2024-11-03 ENCOUNTER — Ambulatory Visit (HOSPITAL_COMMUNITY): Payer: Self-pay

## 2024-11-03 LAB — URINE CULTURE
Culture: >=100000 — AB
Special Requests: NORMAL

## 2024-11-22 ENCOUNTER — Ambulatory Visit: Payer: Self-pay | Admitting: Obstetrics and Gynecology

## 2024-11-23 ENCOUNTER — Ambulatory Visit: Payer: Self-pay | Admitting: Obstetrics and Gynecology
